# Patient Record
Sex: Female | Born: 1995 | Race: White | Hispanic: No | Marital: Married | Smoking: Never smoker
Health system: Southern US, Community
[De-identification: ages and names within clinical notes are randomized; demographics above are authoritative.]

## PROBLEM LIST (undated history)

## (undated) DIAGNOSIS — R1013 Epigastric pain: Secondary | ICD-10-CM

## (undated) DIAGNOSIS — R112 Nausea with vomiting, unspecified: Secondary | ICD-10-CM

## (undated) DIAGNOSIS — E063 Autoimmune thyroiditis: Secondary | ICD-10-CM

## (undated) DIAGNOSIS — F329 Major depressive disorder, single episode, unspecified: Secondary | ICD-10-CM

## (undated) DIAGNOSIS — M25559 Pain in unspecified hip: Secondary | ICD-10-CM

## (undated) DIAGNOSIS — R55 Syncope and collapse: Secondary | ICD-10-CM

## (undated) DIAGNOSIS — R519 Headache, unspecified: Secondary | ICD-10-CM

## (undated) DIAGNOSIS — R51 Headache: Secondary | ICD-10-CM

## (undated) DIAGNOSIS — Z22338 Carrier of other streptococcus: Secondary | ICD-10-CM

## (undated) DIAGNOSIS — I951 Orthostatic hypotension: Secondary | ICD-10-CM

## (undated) DIAGNOSIS — E0580 Other thyrotoxicosis without thyrotoxic crisis or storm: Secondary | ICD-10-CM

## (undated) DIAGNOSIS — F32A Depression, unspecified: Secondary | ICD-10-CM

## (undated) DIAGNOSIS — E049 Nontoxic goiter, unspecified: Secondary | ICD-10-CM

## (undated) DIAGNOSIS — Z7722 Contact with and (suspected) exposure to environmental tobacco smoke (acute) (chronic): Secondary | ICD-10-CM

## (undated) DIAGNOSIS — R5383 Other fatigue: Secondary | ICD-10-CM

## (undated) DIAGNOSIS — R109 Unspecified abdominal pain: Secondary | ICD-10-CM

## (undated) HISTORY — DX: Autoimmune thyroiditis: E06.3

## (undated) HISTORY — DX: Other fatigue: R53.83

## (undated) HISTORY — DX: Nausea with vomiting, unspecified: R11.2

## (undated) HISTORY — DX: Carrier of other streptococcus: Z22.338

## (undated) HISTORY — PX: TONSILLECTOMY AND ADENOIDECTOMY: SHX28

## (undated) HISTORY — DX: Pain in unspecified hip: M25.559

## (undated) HISTORY — DX: Nontoxic goiter, unspecified: E04.9

## (undated) HISTORY — DX: Other thyrotoxicosis without thyrotoxic crisis or storm: E05.80

## (undated) HISTORY — DX: Epigastric pain: R10.13

## (undated) HISTORY — DX: Syncope and collapse: R55

## (undated) HISTORY — DX: Orthostatic hypotension: I95.1

## (undated) HISTORY — DX: Contact with and (suspected) exposure to environmental tobacco smoke (acute) (chronic): Z77.22

## (undated) HISTORY — DX: Unspecified abdominal pain: R10.9

---
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
## 2013-01-19 ENCOUNTER — Encounter (HOSPITAL_COMMUNITY): Payer: Self-pay | Admitting: Emergency Medicine

## 2013-01-19 ENCOUNTER — Emergency Department (HOSPITAL_COMMUNITY)
Admission: EM | Admit: 2013-01-19 | Discharge: 2013-01-19 | Disposition: A | Discharge status: Home / Self Care | Payer: Managed Care, Other (non HMO) | Attending: Emergency Medicine | Admitting: Emergency Medicine

## 2013-01-19 DIAGNOSIS — Z8679 Personal history of other diseases of the circulatory system: Secondary | ICD-10-CM | POA: Insufficient documentation

## 2013-01-19 DIAGNOSIS — N12 Tubulo-interstitial nephritis, not specified as acute or chronic: Secondary | ICD-10-CM | POA: Insufficient documentation

## 2013-01-19 DIAGNOSIS — E063 Autoimmune thyroiditis: Secondary | ICD-10-CM | POA: Insufficient documentation

## 2013-01-19 DIAGNOSIS — Z79899 Other long term (current) drug therapy: Secondary | ICD-10-CM | POA: Insufficient documentation

## 2013-01-19 DIAGNOSIS — Z792 Long term (current) use of antibiotics: Secondary | ICD-10-CM | POA: Insufficient documentation

## 2013-01-19 DIAGNOSIS — Z3202 Encounter for pregnancy test, result negative: Secondary | ICD-10-CM | POA: Insufficient documentation

## 2013-01-19 DIAGNOSIS — E038 Other specified hypothyroidism: Secondary | ICD-10-CM | POA: Insufficient documentation

## 2013-01-19 DIAGNOSIS — Z8719 Personal history of other diseases of the digestive system: Secondary | ICD-10-CM | POA: Insufficient documentation

## 2013-01-19 LAB — CBC WITH DIFFERENTIAL/PLATELET
Basophils Absolute: 0 10*3/uL (ref 0.0–0.1)
Basophils Relative: 1 % (ref 0–1)
Eosinophils Absolute: 0 10*3/uL (ref 0.0–0.7)
Eosinophils Relative: 0 % (ref 0–5)
HCT: 38.9 % (ref 36.0–46.0)
HEMOGLOBIN: 14.3 g/dL (ref 12.0–15.0)
LYMPHS ABS: 1.5 10*3/uL (ref 0.7–4.0)
Lymphocytes Relative: 27 % (ref 12–46)
MCH: 31.6 pg (ref 26.0–34.0)
MCHC: 36.8 g/dL — ABNORMAL HIGH (ref 30.0–36.0)
MCV: 85.9 fL (ref 78.0–100.0)
Monocytes Absolute: 0.4 10*3/uL (ref 0.1–1.0)
Monocytes Relative: 7 % (ref 3–12)
NEUTROS ABS: 3.7 10*3/uL (ref 1.7–7.7)
NEUTROS PCT: 65 % (ref 43–77)
Platelets: 238 10*3/uL (ref 150–400)
RBC: 4.53 MIL/uL (ref 3.87–5.11)
RDW: 12 % (ref 11.5–15.5)
WBC: 5.6 10*3/uL (ref 4.0–10.5)

## 2013-01-19 LAB — COMPREHENSIVE METABOLIC PANEL
ALK PHOS: 42 U/L (ref 39–117)
ALT: 10 U/L (ref 0–35)
AST: 14 U/L (ref 0–37)
Albumin: 4.2 g/dL (ref 3.5–5.2)
BILIRUBIN TOTAL: 0.3 mg/dL (ref 0.3–1.2)
BUN: 5 mg/dL — ABNORMAL LOW (ref 6–23)
CHLORIDE: 102 meq/L (ref 96–112)
CO2: 23 mEq/L (ref 19–32)
Calcium: 9.7 mg/dL (ref 8.4–10.5)
Creatinine, Ser: 0.53 mg/dL (ref 0.50–1.10)
GFR calc non Af Amer: >90 mL/min (ref >90)
GLUCOSE: 99 mg/dL (ref 70–99)
POTASSIUM: 4 meq/L (ref 3.7–5.3)
Sodium: 140 mEq/L (ref 137–147)
Total Protein: 7.3 g/dL (ref 6.0–8.3)

## 2013-01-19 LAB — URINALYSIS, ROUTINE W REFLEX MICROSCOPIC
BILIRUBIN URINE: NEGATIVE
Glucose, UA: NEGATIVE mg/dL
KETONES UR: NEGATIVE mg/dL
NITRITE: NEGATIVE
Protein, ur: 30 mg/dL — AB
Specific Gravity, Urine: 1.014 (ref 1.005–1.030)
Urobilinogen, UA: 1 mg/dL (ref 0.0–1.0)
pH: 6 (ref 5.0–8.0)

## 2013-01-19 LAB — URINE MICROSCOPIC-ADD ON

## 2013-01-19 LAB — POCT PREGNANCY, URINE: Preg Test, Ur: NEGATIVE

## 2013-01-19 MED ORDER — ONDANSETRON 4 MG PO TBDP: 4.0000 mg | ORAL_TABLET | Freq: Three times a day (TID) | ORAL | Status: DC | PRN

## 2013-01-19 MED ORDER — ONDANSETRON HCL 4 MG/2ML IJ SOLN
4.0000 mg | Freq: Once | INTRAMUSCULAR | Status: AC
  Administered 2013-01-19: 4 mg via INTRAVENOUS
  Filled 2013-01-19: qty 2

## 2013-01-19 MED ORDER — SODIUM CHLORIDE 0.9 % IV BOLUS (SEPSIS)
1000.0000 mL | Freq: Once | INTRAVENOUS | Status: AC
  Administered 2013-01-19: 1000 mL via INTRAVENOUS

## 2013-01-19 MED ORDER — DEXTROSE 5 % IV SOLN
1.0000 g | Freq: Once | INTRAVENOUS | Status: AC
  Administered 2013-01-19: 1 g via INTRAVENOUS
  Filled 2013-01-19: qty 10

## 2013-01-19 MED ORDER — CEPHALEXIN 500 MG PO CAPS: 500.0000 mg | ORAL_CAPSULE | Freq: Four times a day (QID) | ORAL | Status: DC

## 2013-01-19 MED ORDER — HYDROCODONE-ACETAMINOPHEN 5-325 MG PO TABS: 2.0000 | ORAL_TABLET | Freq: Four times a day (QID) | ORAL | Status: DC | PRN

## 2013-01-19 MED ORDER — KETOROLAC TROMETHAMINE 30 MG/ML IJ SOLN
30.0000 mg | Freq: Once | INTRAMUSCULAR | Status: AC
  Administered 2013-01-19: 30 mg via INTRAVENOUS
  Filled 2013-01-19: qty 1

## 2013-01-19 NOTE — ED Provider Notes (Signed)
Medical screening examination/treatment/procedure(s) were performed by non-physician practitioner and as supervising physician I was immediately available for consultation/collaboration.  EKG Interpretation   None         Andric Kerce, MD 01/19/13 1950 

## 2013-01-19 NOTE — ED Provider Notes (Signed)
CSN: [id number]     Arrival date & time 01/19/13  1302 History   First MD Initiated Contact with Patient 01/19/13 1536     Chief Complaint  Patient presents with  . Abdominal Pain   (Consider location/radiation/quality/duration/timing/severity/associated sxs/prior Treatment) HPI Comments: Patient presenting with a chief complaint of dysuria, increased urinary frequency, urgency, abdominal pain, right flank pain, nausea, and vomiting.  She reports that the symptoms started three days ago and have been worsening since that time.  She states that she was seen in [facility] ED [facility] two days ago and was diagnosed with a UTI at that time and started on Cipro.  She has taken four doses of Cipro, but does not feel that it is helping.  She reports that a CT scan was done at that time, which was negative for kidney stones.  She denies fever, chills, vaginal discharge, or hematuria.  She has not taken anything for pain or nausea prior to arrival.    Patient is a 18 y.o. female presenting with abdominal pain. The history is provided by the patient.  Abdominal Pain Associated symptoms: dysuria, nausea and vomiting   Associated symptoms: no chills, no fever, no hematuria, no vaginal bleeding and no vaginal discharge     Past Medical History  Diagnosis Date  . Nausea with vomiting   . Pain in joint, pelvic region and thigh     right hip pain  . Tobacco smoke exposure     positive hx of passive tobacco smoke exposure  . 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 Abdominal pain, unspecified site   . Pre-syncope 03/2008    cardiac referral for pre syncope 03/10  . Thyroiditis, autoimmune   . Goiter   . Thyrotoxicosis with Hashimoto thyroiditis   . Hypothyroidism, acquired, autoimmune   . Fatigue   . Dyspepsia   . Orthostatic hypotension    Past Surgical History  Procedure Laterality Date  . Tonsillectomy and adenoidectomy  11/2001  . Tonsillectomy and adenoidectomy      Family History  Problem Relation Age of Onset  . Migraines Mother   . Thyroid disease Mother   . Heart disease Maternal Grandmother   . Diabetes Maternal Grandfather   . Diabetes Paternal Grandfather   . Thyroid disease Paternal Grandfather    History  Substance Use Topics  . Smoking status: Never Smoker   . Smokeless tobacco: Never Used  . Alcohol Use: No   OB History   Grav Para Term Preterm Abortions TAB SAB Ect Mult Living                 Review of Systems  Constitutional: Negative for fever and chills.  Gastrointestinal: Positive for nausea, vomiting and abdominal pain.  Genitourinary: Positive for dysuria, urgency and frequency. Negative for hematuria, vaginal bleeding and vaginal discharge.  All other systems reviewed and are negative.    Allergies  Prochlorperazine edisylate  Home Medications   Current Outpatient Rx  Name  Route  Sig  Dispense  Refill  . acetaminophen (TYLENOL) 500 MG tablet   Oral   Take 1,000 mg by mouth every 6 (six) hours as needed.         . ciprofloxacin (CIPRO) 500 MG tablet   Oral   Take 500 mg by mouth 2 (two) times daily.         . Lactobacillus (ACIDOPHILUS PO)   Oral   Take 1 capsule by mouth daily.         .Marland Kitchen  levothyroxine (SYNTHROID, LEVOTHROID) 25 MCG tablet   Oral   Take 1 tablet (25 mcg total) by mouth daily.   30 tablet   11   . Norethin Ace-Eth Estrad-FE (MINASTRIN 24 FE) 1-20 MG-MCG(24) CHEW   Oral   Chew 1 tablet by mouth daily.   84 tablet   3    BP 109/68  Pulse 72  Temp(Src) 97.5 F (36.4 C) (Oral)  Resp 18  SpO2 100% Physical Exam  Nursing note and vitals reviewed. Constitutional: She appears well-developed and well-nourished.  HENT:  Head: Normocephalic and atraumatic.  Mouth/Throat: Oropharynx is clear and moist.  Neck: Normal range of motion. Neck supple.  Cardiovascular: Normal rate, regular rhythm and normal heart sounds.   Pulmonary/Chest: Effort normal and breath sounds normal.   Abdominal: Soft. Bowel sounds are normal. She exhibits no distension and no mass. There is tenderness in the suprapubic area. There is no rebound and no guarding.  Right CVA tenderness  Musculoskeletal: Normal range of motion.  Neurological: She is alert.  Skin: Skin is warm and dry.  Psychiatric: She has a normal mood and affect.    ED Course  Procedures (including critical care time) Labs Review Labs Reviewed  CBC WITH DIFFERENTIAL - Abnormal; Notable for the following:    MCHC 36.8 (*)    All other components within normal limits  COMPREHENSIVE METABOLIC PANEL - Abnormal; Notable for the following:    BUN 5 (*)    All other components within normal limits  URINALYSIS, ROUTINE W REFLEX MICROSCOPIC - Abnormal; Notable for the following:    APPearance TURBID (*)    Hgb urine dipstick MODERATE (*)    Protein, ur 30 (*)    Leukocytes, UA LARGE (*)    All other components within normal limits  URINE MICROSCOPIC-ADD ON - Abnormal; Notable for the following:    Squamous Epithelial / LPF MANY (*)    Bacteria, UA MANY (*)    All other components within normal limits  URINE CULTURE  POCT PREGNANCY, URINE   Imaging Review No results found.  EKG Interpretation   None     7:00 PM Reassessed patient.  She reports that her nausea has improved.  Will fluid challenge and reassess.  7:30 PM Reassessed patient.  She reports that her pain and nausea have improved.  Repeat abdominal exam.  Mild diffuse abdominal tenderness.  No rebound or guarding.  MDM  No diagnosis found. Patient presenting with urinary symptoms, flank pain, nausea, and vomiting.  UA showing UTI.  Patient is currently afebrile.  Patient given IV Rocephin while in the ED.  Patient also given Zofran IV, which improved nausea.  Patient able to tolerate PO liquids prior to discharge.  Labs unremarkable.  Urine pregnancy negative. Urine cultured.  Symptoms most consistent with Pyelonephritis.  Patient discharged home with  pain medication, Zofran ODT, and Keflex.  Return precautions given.   Santiago Glad, PA-C 01/19/13 1942  Santiago Glad, PA-C 01/19/13 1942

## 2013-01-19 NOTE — ED Notes (Signed)
Pt presents to department for evaluation of diffuse abdominal pain. Was seen at [facility] on Friday, diagnosed with UTI and started on Cipro. Now states no relief from symptoms, continued abdominal pain and frequent urination. 8/10 pain upon arrival. Pt is alert and oriented x4.

## 2013-01-19 NOTE — Discharge Instructions (Signed)
Take pain medication as needed for severe pain.  Do not drive or operate heavy machinery for four hours after taking pain medication.

## 2013-01-20 LAB — URINE CULTURE
CULTURE: NO GROWTH
Colony Count: NO GROWTH

## 2013-01-29 ENCOUNTER — Other Ambulatory Visit: Payer: Self-pay

## 2013-01-29 DIAGNOSIS — E039 Hypothyroidism, unspecified: Secondary | ICD-10-CM

## 2013-01-29 DIAGNOSIS — E038 Other specified hypothyroidism: Secondary | ICD-10-CM

## 2013-01-29 MED ORDER — LEVOTHYROXINE SODIUM 25 MCG PO TABS: 25.0000 ug | ORAL_TABLET | Freq: Every day | ORAL | Status: DC

## 2013-01-29 MED ORDER — NORETHIN ACE-ETH ESTRAD-FE 1-20 MG-MCG(24) PO CHEW: 1.0000 | CHEWABLE_TABLET | Freq: Every day | ORAL | Status: DC

## 2013-01-29 NOTE — Telephone Encounter (Signed)
Patient's insurance is requiring mail order, so i forwarded the requested Rx with remaining refills  

## 2013-01-29 NOTE — Addendum Note (Signed)
Addended by: Roena MaladyEVONTENNO, Moesha Sarchet Y on: 01/29/2013 09:17 AM   Modules accepted: Orders

## 2013-02-10 ENCOUNTER — Encounter: Payer: Self-pay | Admitting: Family Medicine

## 2013-02-10 ENCOUNTER — Ambulatory Visit (INDEPENDENT_AMBULATORY_CARE_PROVIDER_SITE_OTHER): Payer: Managed Care, Other (non HMO) | Admitting: Family Medicine

## 2013-02-10 VITALS — BP 98/62 | HR 119 | Temp 97.8°F | Ht 63.5 in | Wt 122.5 lb

## 2013-02-10 DIAGNOSIS — N12 Tubulo-interstitial nephritis, not specified as acute or chronic: Secondary | ICD-10-CM

## 2013-02-10 DIAGNOSIS — F329 Major depressive disorder, single episode, unspecified: Secondary | ICD-10-CM

## 2013-02-10 DIAGNOSIS — Z8659 Personal history of other mental and behavioral disorders: Secondary | ICD-10-CM | POA: Insufficient documentation

## 2013-02-10 DIAGNOSIS — Z87440 Personal history of urinary (tract) infections: Secondary | ICD-10-CM

## 2013-02-10 DIAGNOSIS — F32A Depression, unspecified: Secondary | ICD-10-CM

## 2013-02-10 DIAGNOSIS — F3289 Other specified depressive episodes: Secondary | ICD-10-CM

## 2013-02-10 LAB — POCT URINALYSIS DIPSTICK
Glucose, UA: NEGATIVE
NITRITE UA: NEGATIVE
PH UA: 6
SPEC GRAV UA: 1.025
Urobilinogen, UA: 0.2

## 2013-02-10 MED ORDER — SERTRALINE HCL 50 MG PO TABS: 50.0000 mg | ORAL_TABLET | Freq: Every day | ORAL | Status: DC

## 2013-02-10 NOTE — Patient Instructions (Signed)
Start sertaline (zoloft) 1/2 pill daily in the evening with food for 2 weeks and then increase to a whole pill daily Stop up front for a referral to counseling I will culture your urine and update you with a result Go for some walks  Follow up with me in about a month   If you get worse / suicidal thoughts - stop medicine/ alert me / get help

## 2013-02-10 NOTE — Assessment & Plan Note (Addendum)
Improved symptoms  ua - blood (pt has menses)- few leukocytes  Will send for culture Disc imp of good water intake  Rev ED notes and studies in detail

## 2013-02-10 NOTE — Assessment & Plan Note (Signed)
Reviewed stressors/ coping techniques/symptoms/ support sources/ tx options and side effects in detail today Will begin zoloft 25 mg -titrate to 50 mg as tolerated Disc poss side eff in detail - incl worse dep or SI  Family will watch her closely  Ref to counseling F/u planned  >25 minutes spent in face to face time with patient, >50% spent in counselling or coordination of care

## 2013-02-10 NOTE — Progress Notes (Signed)
Subjective:    Patient ID: Mary Crawford, female    DOB: [birth year], 18 y.o.   MRN: [id number]  HPI Here for f/u of ER visit for pyelonephritis on 1/18  Was first seen at [facility] ctr per pt -given cipro for uti and CT neg F/u with cone- no symptom imp on 1/18 She was given rocephin and zofran - then d/c on keflex ucx was neg but pos uti   Wt is down 15lb  Results for orders placed in visit on 02/10/13  POCT URINALYSIS DIPSTICK      Result Value Range   Color, UA yellow     Clarity, UA hazy     Glucose, UA neg.     Bilirubin, UA small     Ketones, UA trace     Spec Grav, UA 1.025     Blood, UA large     pH, UA 6.0     Protein, UA 30+     Urobilinogen, UA 0.2     Nitrite, UA neg.     Leukocytes, UA Trace     she is having period   Lost 15 lb  She is having a lot of mood issues  "can't eat" and gets nausea -- thinks that is from the mood  Had to drop out of school  Had bad experience - boyfriend was cheating on her with a friend , and also somewhat overwhelmed at college - followed by a kidney infection  Also that college was not a good fit for her - she is looking at some other choices  Is "mopey"  She feels vegetative at times and anxious at times Thinks this is the first times she has had depression Thinks her mother has depression-and takes medicine  Pt has never seen a counselor and never been on medicine   Coping mech- watches TV and also gets on the internet Does not feel like going out  Does not feel like exercise - but likes to walk  No suicidal intention - ? If she has had thoughts in the past  Living with her parents right now   appetitie is lousy Wakes up a lot at night   Lab Results  Component Value Date   TSH 1.81 10/14/2012     Patient Active Problem List   Diagnosis Date Noted  . Pyelonephritis 02/10/2013  . Uses oral contraception 10/14/2012  . Frequent urination 10/14/2012  . Well adolescent visit 06/19/2011  . Goiter   . History  of Hashimoto thyroiditis   . Hypothyroidism, acquired, autoimmune   . Orthostatic hypotension   . HYPERTHYROIDISM 12/18/2007  . CARRIER OR SUSPECTED CARRIER OTHER STREPTOCOCCUS 02/04/2007   Past Medical History  Diagnosis Date  . Nausea with vomiting   . Pain in joint, pelvic region and thigh     right hip pain  . Tobacco smoke exposure     positive hx of passive tobacco smoke exposure  . 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 Abdominal pain, unspecified site   . Pre-syncope 03/2008    cardiac referral for pre syncope 03/10  . Thyroiditis, autoimmune   . Goiter   . Thyrotoxicosis with Hashimoto thyroiditis   . Hypothyroidism, acquired, autoimmune   . Fatigue   . Dyspepsia   . Orthostatic hypotension    Past Surgical History  Procedure Laterality Date  . Tonsillectomy and adenoidectomy  11/2001  . Tonsillectomy and adenoidectomy     History  Substance Use Topics  .  Smoking status: Never Smoker   . Smokeless tobacco: Never Used  . Alcohol Use: No   Family History  Problem Relation Age of Onset  . Migraines Mother   . Thyroid disease Mother   . Heart disease Maternal Grandmother   . Diabetes Maternal Grandfather   . Diabetes Paternal Grandfather   . Thyroid disease Paternal Grandfather    Allergies  Allergen Reactions  . Prochlorperazine Edisylate     REACTION: anxiety   Current Outpatient Prescriptions on File Prior to Visit  Medication Sig Dispense Refill  . levothyroxine (SYNTHROID, LEVOTHROID) 25 MCG tablet Take 1 tablet (25 mcg total) by mouth daily.  90 tablet  3  . Norethin Ace-Eth Estrad-FE (MINASTRIN 24 FE) 1-20 MG-MCG(24) CHEW Chew 1 tablet by mouth daily.  84 tablet  2  . ondansetron (ZOFRAN ODT) 4 MG disintegrating tablet Take 1 tablet (4 mg total) by mouth every 8 (eight) hours as needed for nausea or vomiting.  20 tablet  0  . acetaminophen (TYLENOL) 500 MG tablet Take 1,000 mg by mouth every 6 (six) hours as needed.      Marland Kitchen.  HYDROcodone-acetaminophen (NORCO/VICODIN) 5-325 MG per tablet Take 2 tablets by mouth every 6 (six) hours as needed.  15 tablet  0   No current facility-administered medications on file prior to visit.    Review of Systems Review of Systems  Constitutional: Negative for fever, appetite change, fatigue and unexpected weight change.  Eyes: Negative for pain and visual disturbance.  Respiratory: Negative for cough and shortness of breath.   Cardiovascular: Negative for cp or palpitations    Gastrointestinal: Negative for nausea, diarrhea and constipation.  Genitourinary: Negative for urgency and frequency. pos for occ bladder discomfort/neg for dysuria  Skin: Negative for pallor or rash   Neurological: Negative for weakness, light-headedness, numbness and headaches.  Hematological: Negative for adenopathy. Does not bruise/bleed easily.  Psychiatric/Behavioral: pos for symptoms of depression and anxiety , neg for suicidal intent.         Objective:   Physical Exam  Constitutional: She appears well-developed and well-nourished. No distress.  HENT:  Head: Normocephalic and atraumatic.  Mouth/Throat: Oropharynx is clear and moist.  Eyes: Conjunctivae and EOM are normal. Pupils are equal, round, and reactive to light. Right eye exhibits no discharge. Left eye exhibits no discharge. No scleral icterus.  Neck: Normal range of motion. Neck supple. No thyromegaly present.  Cardiovascular: Normal rate, regular rhythm, normal heart sounds and intact distal pulses.  Exam reveals no gallop.   Pulmonary/Chest: Effort normal and breath sounds normal. No respiratory distress. She has no wheezes. She has no rales.  Abdominal: Soft. Bowel sounds are normal. She exhibits no distension and no mass. There is no tenderness.  No suprapubic tenderness or fullness   No cva tenderness   Musculoskeletal: She exhibits no edema and no tenderness.  Lymphadenopathy:    She has no cervical adenopathy.  Neurological:  She is alert. She has normal reflexes. No cranial nerve deficit. She exhibits normal muscle tone. Coordination normal.  Skin: Skin is warm and dry. No rash noted. No erythema. No pallor.  Psychiatric: Her behavior is normal. Thought content normal. Her mood appears anxious. Her affect is not blunt and not labile. Thought content is not paranoid. Cognition and memory are normal. She exhibits a depressed mood. She expresses no homicidal and no suicidal ideation.  Pt speaks candidly about stressors and mood  Good attentiveness occ tearful  Assessment & Plan:

## 2013-02-10 NOTE — Progress Notes (Signed)
Pre-visit discussion using our clinic review tool. No additional management support is needed unless otherwise documented below in the visit note.  

## 2013-02-11 LAB — URINE CULTURE
Colony Count: NO GROWTH
Organism ID, Bacteria: NO GROWTH

## 2013-02-27 ENCOUNTER — Ambulatory Visit: Payer: Managed Care, Other (non HMO) | Admitting: Psychology

## 2013-03-10 ENCOUNTER — Ambulatory Visit: Payer: Managed Care, Other (non HMO) | Admitting: Family Medicine

## 2013-03-11 ENCOUNTER — Encounter: Payer: Self-pay | Admitting: Family Medicine

## 2013-03-11 ENCOUNTER — Ambulatory Visit (INDEPENDENT_AMBULATORY_CARE_PROVIDER_SITE_OTHER): Payer: Managed Care, Other (non HMO) | Admitting: Family Medicine

## 2013-03-11 VITALS — BP 94/62 | HR 91 | Temp 98.3°F | Ht 63.5 in | Wt 122.5 lb

## 2013-03-11 DIAGNOSIS — F32A Depression, unspecified: Secondary | ICD-10-CM

## 2013-03-11 DIAGNOSIS — F329 Major depressive disorder, single episode, unspecified: Secondary | ICD-10-CM

## 2013-03-11 DIAGNOSIS — F3289 Other specified depressive episodes: Secondary | ICD-10-CM

## 2013-03-11 MED ORDER — SERTRALINE HCL 50 MG PO TABS: 50.0000 mg | ORAL_TABLET | Freq: Every day | ORAL | Status: DC

## 2013-03-11 NOTE — Progress Notes (Signed)
Pre visit review using our clinic review tool, if applicable. No additional management support is needed unless otherwise documented below in the visit note. 

## 2013-03-11 NOTE — Patient Instructions (Signed)
I'm glad you are feeling better  Stay on current dose of zoloft  For sleep- try benadryl 25-50 mg in late evening or bedtime  Get exercise Avoid caffeine  Let me know if no improvement  Follow up in about 6 months

## 2013-03-11 NOTE — Progress Notes (Signed)
Subjective:    Patient ID: Mary Crawford, female    DOB: [birth year], 18 y.o.   MRN: [id number]  HPI Here for f/u of symptoms of depression   Last visit started zoloft to titrate up to 50 mg  Thinks the medicine helped a lot  Was nauseated at first-that is better now   Some trouble falling/ staying asleep at night ? Side eff   Wt is stable  Appetite is better -eating a lot more   More relaxed  Not tearful or down in the dumps  Stress level is "not too bad"  Patient Active Problem List   Diagnosis Date Noted  . Pyelonephritis 02/10/2013  . Depression 02/10/2013  . Uses oral contraception 10/14/2012  . Frequent urination 10/14/2012  . Well adolescent visit 06/19/2011  . Goiter   . History of Hashimoto thyroiditis   . Hypothyroidism, acquired, autoimmune   . Orthostatic hypotension   . HYPERTHYROIDISM 12/18/2007  . CARRIER OR SUSPECTED CARRIER OTHER STREPTOCOCCUS 02/04/2007   Past Medical History  Diagnosis Date  . Nausea with vomiting   . Pain in joint, pelvic region and thigh     right hip pain  . Tobacco smoke exposure     positive hx of passive tobacco smoke exposure  . 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 Abdominal pain, unspecified site   . Pre-syncope 03/2008    cardiac referral for pre syncope 03/10  . Thyroiditis, autoimmune   . Goiter   . Thyrotoxicosis with Hashimoto thyroiditis   . Hypothyroidism, acquired, autoimmune   . Fatigue   . Dyspepsia   . Orthostatic hypotension    Past Surgical History  Procedure Laterality Date  . Tonsillectomy and adenoidectomy  11/2001  . Tonsillectomy and adenoidectomy     History  Substance Use Topics  . Smoking status: Never Smoker   . Smokeless tobacco: Never Used  . Alcohol Use: No   Family History  Problem Relation Age of Onset  . Migraines Mother   . Thyroid disease Mother   . Heart disease Maternal Grandmother   . Diabetes Maternal Grandfather   . Diabetes Paternal Grandfather    . Thyroid disease Paternal Grandfather    Allergies  Allergen Reactions  . Prochlorperazine Edisylate     REACTION: anxiety   Current Outpatient Prescriptions on File Prior to Visit  Medication Sig Dispense Refill  . levothyroxine (SYNTHROID, LEVOTHROID) 25 MCG tablet Take 1 tablet (25 mcg total) by mouth daily.  90 tablet  3  . Norethin Ace-Eth Estrad-FE (MINASTRIN 24 FE) 1-20 MG-MCG(24) CHEW Chew 1 tablet by mouth daily.  84 tablet  2  . sertraline (ZOLOFT) 50 MG tablet Take 1 tablet (50 mg total) by mouth daily.  30 tablet  3   No current facility-administered medications on file prior to visit.      Review of Systems Review of Systems  Constitutional: Negative for fever, appetite change, fatigue and unexpected weight change.  Eyes: Negative for pain and visual disturbance.  Respiratory: Negative for cough and shortness of breath.   Cardiovascular: Negative for cp or palpitations    Gastrointestinal: Negative for nausea, diarrhea and constipation.  Genitourinary: Negative for urgency and frequency.  Skin: Negative for pallor or rash   Neurological: Negative for weakness, light-headedness, numbness and headaches.  Hematological: Negative for adenopathy. Does not bruise/bleed easily.  Psychiatric/Behavioral: Negative for dysphoric mood. The patient is not nervous/anxious.  pos for some difficulty sleeping  Objective:   Physical Exam  Constitutional: She appears well-developed and well-nourished. No distress.  HENT:  Head: Normocephalic and atraumatic.  Eyes: Conjunctivae and EOM are normal. Pupils are equal, round, and reactive to light.  Neck: Normal range of motion. Neck supple. No thyromegaly present.  Cardiovascular: Normal rate and regular rhythm.   Pulmonary/Chest: Effort normal and breath sounds normal.  Lymphadenopathy:    She has no cervical adenopathy.  Neurological: She is alert. She has normal reflexes. She displays no tremor. No cranial nerve deficit.  She exhibits normal muscle tone. Coordination normal.  Skin: Skin is warm and dry. No rash noted. No pallor.  Psychiatric: She has a normal mood and affect.  Much improved           Assessment & Plan:

## 2013-03-12 NOTE — Assessment & Plan Note (Signed)
Much imp with zoloft  Some trouble sleeping-disc sleep hygeine / need for exercise and also suggested benadryl at bedtime Will update

## 2013-05-28 ENCOUNTER — Ambulatory Visit (INDEPENDENT_AMBULATORY_CARE_PROVIDER_SITE_OTHER): Payer: Managed Care, Other (non HMO) | Admitting: Family Medicine

## 2013-05-28 ENCOUNTER — Encounter: Payer: Self-pay | Admitting: Family Medicine

## 2013-05-28 VITALS — BP 104/62 | HR 114 | Temp 98.6°F | Ht 64.0 in | Wt 128.5 lb

## 2013-05-28 DIAGNOSIS — Z0289 Encounter for other administrative examinations: Secondary | ICD-10-CM

## 2013-05-28 DIAGNOSIS — Z020 Encounter for examination for admission to educational institution: Secondary | ICD-10-CM | POA: Insufficient documentation

## 2013-05-28 DIAGNOSIS — G43009 Migraine without aura, not intractable, without status migrainosus: Secondary | ICD-10-CM | POA: Insufficient documentation

## 2013-05-28 NOTE — Patient Instructions (Signed)
For headache prevention avoid caffeine and drink more water, try to go to bed and get up at the same time every day  Avoid otc medicines if possible -- to avoid analgesic rebound headaches  Have the staff up front copy your immunization form  No restrictions for college

## 2013-05-28 NOTE — Progress Notes (Signed)
Pre visit review using our clinic review tool, if applicable. No additional management support is needed unless otherwise documented below in the visit note. 

## 2013-05-28 NOTE — Progress Notes (Signed)
Subjective:    Patient ID: Mary Crawford, female    DOB: [birth year], 18 y.o.   MRN: [id number]  HPI Here for pre college visit - [employer] - wants to get degree in education Is excited about that  Has a relaxing summer planned   Mood has been good Medicine is still working   More migraines lately (they run in her family)  Gets a headache every day-thinks it is stress related  Has been to a headache specialist in the past  No aura  Throbbing pain - both sides of head  Takes a tylenol and lies down in dark room (photophobia) - usually better in 1-2 hours Usually gets them in the afternoon   Sleep - she thinks she gets too much 9-12  Different bed and wake times No caffeine  Not enough water   No relation to med change   She does not want to go on proph med-would like to try lifestyle   Patient Active Problem List   Diagnosis Date Noted  . Pyelonephritis 02/10/2013  . Depression 02/10/2013  . Uses oral contraception 10/14/2012  . Well adolescent visit 06/19/2011  . Goiter   . History of Hashimoto thyroiditis   . Hypothyroidism, acquired, autoimmune   . Orthostatic hypotension   . HYPERTHYROIDISM 12/18/2007  . CARRIER OR SUSPECTED CARRIER OTHER STREPTOCOCCUS 02/04/2007   Past Medical History  Diagnosis Date  . Nausea with vomiting   . Pain in joint, pelvic region and thigh     right hip pain  . Tobacco smoke exposure     positive hx of passive tobacco smoke exposure  . 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 Abdominal pain, unspecified site   . Pre-syncope 03/2008    cardiac referral for pre syncope 03/10  . Thyroiditis, autoimmune   . Goiter   . Thyrotoxicosis with Hashimoto thyroiditis   . Hypothyroidism, acquired, autoimmune   . Fatigue   . Dyspepsia   . Orthostatic hypotension    Past Surgical History  Procedure Laterality Date  . Tonsillectomy and adenoidectomy  11/2001  . Tonsillectomy and adenoidectomy     History    Substance Use Topics  . Smoking status: Never Smoker   . Smokeless tobacco: Never Used  . Alcohol Use: No   Family History  Problem Relation Age of Onset  . Migraines Mother   . Thyroid disease Mother   . Heart disease Maternal Grandmother   . Diabetes Maternal Grandfather   . Diabetes Paternal Grandfather   . Thyroid disease Paternal Grandfather    Allergies  Allergen Reactions  . Prochlorperazine Edisylate     REACTION: anxiety   Current Outpatient Prescriptions on File Prior to Visit  Medication Sig Dispense Refill  . levothyroxine (SYNTHROID, LEVOTHROID) 25 MCG tablet Take 1 tablet (25 mcg total) by mouth daily.  90 tablet  3  . Norethin Ace-Eth Estrad-FE (MINASTRIN 24 FE) 1-20 MG-MCG(24) CHEW Chew 1 tablet by mouth daily.  84 tablet  2  . sertraline (ZOLOFT) 50 MG tablet Take 1 tablet (50 mg total) by mouth daily.  90 tablet  3   No current facility-administered medications on file prior to visit.     Review of Systems Review of Systems  Constitutional: Negative for fever, appetite change, fatigue and unexpected weight change.  Eyes: Negative for pain and visual disturbance.  Respiratory: Negative for cough and shortness of breath.   Cardiovascular: Negative for cp or palpitations    Gastrointestinal:  Negative for nausea, diarrhea and constipation.  Genitourinary: Negative for urgency and frequency.  Skin: Negative for pallor or rash   Neurological: Negative for weakness, light-headedness, numbness and pos for headaches   Hematological: Negative for adenopathy. Does not bruise/bleed easily.  Psychiatric/Behavioral: Negative for dysphoric mood. The patient is not nervous/anxious.         Objective:   Physical Exam  Constitutional: She appears well-developed and well-nourished. No distress.  HENT:  Head: Normocephalic and atraumatic.  Right Ear: External ear normal.  Left Ear: External ear normal.  Nose: Nose normal.  Mouth/Throat: Oropharynx is clear and  moist.  Eyes: Conjunctivae and EOM are normal. Pupils are equal, round, and reactive to light. Right eye exhibits no discharge. Left eye exhibits no discharge. No scleral icterus.  Neck: Normal range of motion. Neck supple. No JVD present. No thyromegaly present.  Cardiovascular: Normal rate, regular rhythm, normal heart sounds and intact distal pulses.  Exam reveals no gallop.   Pulmonary/Chest: Effort normal and breath sounds normal. No respiratory distress. She has no wheezes. She has no rales.  Abdominal: Soft. Bowel sounds are normal. She exhibits no distension and no mass. There is no tenderness.  Musculoskeletal: She exhibits no edema and no tenderness.  Lymphadenopathy:    She has no cervical adenopathy.  Neurological: She is alert. She has normal strength and normal reflexes. She displays no atrophy and no tremor. No cranial nerve deficit or sensory deficit. She exhibits normal muscle tone. Coordination and gait normal.  Nl gait  No cerebellar signs   Skin: Skin is warm and dry. No rash noted. No erythema. No pallor.  Psychiatric: She has a normal mood and affect.          Assessment & Plan:

## 2013-05-29 NOTE — Assessment & Plan Note (Signed)
No restrictions for college Doing well physically/developmentally Mary Crawford and academically  Antic guidance for college and gen health Stressed self care  Pt declines std screen

## 2013-05-29 NOTE — Assessment & Plan Note (Signed)
Reviewed lifestyle change in detail  See AVS  Pt does not want to take proph med at this time Disc analgesic rebound as well  Handouts given  Will update if no imp

## 2013-09-15 ENCOUNTER — Other Ambulatory Visit: Payer: Self-pay | Admitting: Family Medicine

## 2013-10-06 ENCOUNTER — Encounter: Payer: Self-pay | Admitting: Family Medicine

## 2013-10-06 ENCOUNTER — Ambulatory Visit (INDEPENDENT_AMBULATORY_CARE_PROVIDER_SITE_OTHER): Payer: Managed Care, Other (non HMO) | Admitting: Family Medicine

## 2013-10-06 VITALS — BP 104/64 | HR 94 | Temp 99.1°F | Ht 64.0 in | Wt 142.2 lb

## 2013-10-06 DIAGNOSIS — M546 Pain in thoracic spine: Secondary | ICD-10-CM

## 2013-10-06 LAB — POCT URINALYSIS DIPSTICK
BILIRUBIN UA: NEGATIVE
Blood, UA: NEGATIVE
GLUCOSE UA: NEGATIVE
KETONES UA: NEGATIVE
Leukocytes, UA: NEGATIVE
NITRITE UA: NEGATIVE
PH UA: 6
Protein, UA: NEGATIVE
Spec Grav, UA: 1.02
Urobilinogen, UA: 0.2

## 2013-10-06 MED ORDER — MELOXICAM 15 MG PO TABS: 15.0000 mg | ORAL_TABLET | Freq: Every day | ORAL | Status: DC | PRN

## 2013-10-06 MED ORDER — CYCLOBENZAPRINE HCL 10 MG PO TABS: 5.0000 mg | ORAL_TABLET | Freq: Three times a day (TID) | ORAL | Status: DC | PRN

## 2013-10-06 NOTE — Progress Notes (Signed)
Pre visit review using our clinic review tool, if applicable. No additional management support is needed unless otherwise documented below in the visit note. 

## 2013-10-06 NOTE — Progress Notes (Signed)
Subjective:    Patient ID: Mary Crawford, female    DOB: 1995-01-10, 18 y.o.   MRN: [id number]  HPI Here for back pain  It started on Thursday   Also pain over L shoulder blade area  Pain is all the way down - worse on the left  Neck is fine  She wears her father's lumbar support brace-it is helpful   She does carry heavy books to class   Best to walk and stand  Worse to sit  Lying down flat is better   No pain rad down legs  No numbness/ weakness  Took tylenol-no help  Has not taken an nsaid   Results for orders placed in visit on 02/10/13  URINE CULTURE      Result Value Ref Range   Colony Count NO GROWTH     Organism ID, Bacteria NO GROWTH    POCT URINALYSIS DIPSTICK      Result Value Ref Range   Color, UA yellow     Clarity, UA hazy     Glucose, UA neg.     Bilirubin, UA small     Ketones, UA trace     Spec Grav, UA 1.025     Blood, UA large     pH, UA 6.0     Protein, UA 30+     Urobilinogen, UA 0.2     Nitrite, UA neg.     Leukocytes, UA Trace        Patient Active Problem List   Diagnosis Date Noted  . Back pain, thoracic 10/06/2013  . School health examination 05/28/2013  . Migraine without aura 05/28/2013  . Pyelonephritis 02/10/2013  . Depression 02/10/2013  . Uses oral contraception 10/14/2012  . Well adolescent visit 06/19/2011  . Goiter   . History of Hashimoto thyroiditis   . Hypothyroidism, acquired, autoimmune   . Orthostatic hypotension   . HYPERTHYROIDISM 12/18/2007  . CARRIER OR SUSPECTED CARRIER OTHER STREPTOCOCCUS 02/04/2007   Past Medical History  Diagnosis Date  . Nausea with vomiting   . Pain in joint, pelvic region and thigh     right hip pain  . Tobacco smoke exposure     positive hx of passive tobacco smoke exposure  . 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 Abdominal pain, unspecified site   . Pre-syncope 03/2008    cardiac referral for pre syncope 03/10  . Thyroiditis, autoimmune   . Goiter     . Thyrotoxicosis with Hashimoto thyroiditis   . Hypothyroidism, acquired, autoimmune   . Fatigue   . Dyspepsia   . Orthostatic hypotension    Past Surgical History  Procedure Laterality Date  . Tonsillectomy and adenoidectomy  11/2001  . Tonsillectomy and adenoidectomy     History  Substance Use Topics  . Smoking status: Never Smoker   . Smokeless tobacco: Never Used  . Alcohol Use: No   Family History  Problem Relation Age of Onset  . Migraines Mother   . Thyroid disease Mother   . Heart disease Maternal Grandmother   . Diabetes Maternal Grandfather   . Diabetes Paternal Grandfather   . Thyroid disease Paternal Grandfather    Allergies  Allergen Reactions  . Prochlorperazine Edisylate     REACTION: anxiety   Current Outpatient Prescriptions on File Prior to Visit  Medication Sig Dispense Refill  . levothyroxine (SYNTHROID, LEVOTHROID) 25 MCG tablet Take 1 tablet (25 mcg total) by mouth daily.  90 tablet  3  .  MINASTRIN 24 FE 1-20 MG-MCG(24) CHEW CHEW AND SWALLOW 1 TABLET DAILY  84 tablet  1  . sertraline (ZOLOFT) 50 MG tablet Take 1 tablet (50 mg total) by mouth daily.  90 tablet  3   No current facility-administered medications on file prior to visit.    Review of Systems Review of Systems  Constitutional: Negative for fever, appetite change, fatigue and unexpected weight change.  Eyes: Negative for pain and visual disturbance.  Respiratory: Negative for cough and shortness of breath.   Cardiovascular: Negative for cp or palpitations    Gastrointestinal: Negative for nausea, diarrhea and constipation.  Genitourinary: Negative for urgency and frequency.  Skin: Negative for pallor or rash   MSK pos for back pain  Neurological: Negative for weakness, light-headedness, numbness and headaches.  Hematological: Negative for adenopathy. Does not bruise/bleed easily.  Psychiatric/Behavioral: Negative for dysphoric mood. The patient is not nervous/anxious.          Objective:   Physical Exam  Constitutional: She appears well-developed and well-nourished. No distress.  HENT:  Head: Normocephalic and atraumatic.  Mouth/Throat: Oropharynx is clear and moist.  Eyes: Conjunctivae and EOM are normal. Pupils are equal, round, and reactive to light.  Neck: Normal range of motion. Neck supple.  Cardiovascular: Normal rate, regular rhythm, normal heart sounds and intact distal pulses.   Pulmonary/Chest: Effort normal and breath sounds normal. No respiratory distress. She has no wheezes. She has no rales.  Musculoskeletal: She exhibits tenderness. She exhibits no edema.  Tender in L thoracic musculature (esp rhomboid area) and L trapezius  No spinal tenderness Nl rom of neck and spine   Some reprod of pain on rhomboid stretch Nl rom shoulder -neg hawking and neer tests  Lymphadenopathy:    She has no cervical adenopathy.  Neurological: She is alert. She has normal reflexes. No cranial nerve deficit. She exhibits normal muscle tone. Coordination normal.  Skin: Skin is warm and dry. No rash noted. No erythema. No pallor.  Psychiatric: She has a normal mood and affect.          Assessment & Plan:   Problem List Items Addressed This Visit     Other   Back pain, thoracic - Primary     This radiates up and down whole back but seems to come from L rhomboid area  Taught stretch Disc use of heat and continued walking and the correct way to carry a backpack meloxicam with food prn  Flexeril when not working or driving Update if not starting to improve in a week or if worsening      Relevant Medications      meloxicam (MOBIC) tablet      cyclobenzaprine (FLEXERIL) tablet   Other Relevant Orders      POCT urinalysis dipstick (Completed)

## 2013-10-06 NOTE — Patient Instructions (Signed)
Do the rhomboid stretch I showed you  Use heat when you can  Keep moving - you will stiffen up if still too long Use both arms for backpack-not just one  Try meloxicam with food as needed for pain  Flexeril -muscle relaxer when not working or driving  Update if not starting to improve in a week or if worsening

## 2013-10-06 NOTE — Assessment & Plan Note (Signed)
This radiates up and down whole back but seems to come from L rhomboid area  Taught stretch Disc use of heat and continued walking and the correct way to carry a backpack meloxicam with food prn  Flexeril when not working or driving Update if not starting to improve in a week or if worsening

## 2014-01-14 ENCOUNTER — Ambulatory Visit (INDEPENDENT_AMBULATORY_CARE_PROVIDER_SITE_OTHER): Payer: Managed Care, Other (non HMO) | Admitting: Family Medicine

## 2014-01-14 ENCOUNTER — Encounter: Payer: Self-pay | Admitting: *Deleted

## 2014-01-14 ENCOUNTER — Encounter: Payer: Self-pay | Admitting: Family Medicine

## 2014-01-14 VITALS — BP 100/64 | HR 108 | Temp 98.6°F | Ht 64.0 in | Wt 142.0 lb

## 2014-01-14 DIAGNOSIS — N946 Dysmenorrhea, unspecified: Secondary | ICD-10-CM | POA: Insufficient documentation

## 2014-01-14 DIAGNOSIS — E038 Other specified hypothyroidism: Secondary | ICD-10-CM

## 2014-01-14 DIAGNOSIS — E063 Autoimmune thyroiditis: Secondary | ICD-10-CM

## 2014-01-14 LAB — TSH: TSH: 1.88 u[IU]/mL (ref 0.35–5.50)

## 2014-01-14 MED ORDER — NORETHIN ACE-ETH ESTRAD-FE 1-20 MG-MCG(24) PO TABS: 1.0000 | ORAL_TABLET | Freq: Every day | ORAL | Status: DC

## 2014-01-14 NOTE — Patient Instructions (Signed)
Try the non chewable loestrin 24   1/20 This should still be generic For cramps take 2 aleve with food twice daily  If no improvement in the next several periods let me knwo   Lab today for thyroid

## 2014-01-14 NOTE — Progress Notes (Signed)
Pre visit review using our clinic review tool, if applicable. No additional management support is needed unless otherwise documented below in the visit note. 
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
## 2014-01-15 ENCOUNTER — Telehealth: Payer: Self-pay | Admitting: *Deleted

## 2014-01-15 DIAGNOSIS — E039 Hypothyroidism, unspecified: Secondary | ICD-10-CM

## 2014-01-15 MED ORDER — LEVOTHYROXINE SODIUM 25 MCG PO TABS: 25.0000 ug | ORAL_TABLET | Freq: Every day | ORAL | Status: DC

## 2014-01-15 NOTE — Telephone Encounter (Signed)
-----   Message from Judy PimpleMarne A Tower, MD sent at 01/14/2014  1:45 PM EST ----- No change needed in her levothyroxine Can refill for a year if needed

## 2014-01-15 NOTE — Telephone Encounter (Signed)
Pt notified of labs and med refilled for a yr.

## 2014-01-15 NOTE — Assessment & Plan Note (Signed)
Lab today for tsh No clinical changes

## 2014-01-15 NOTE — Assessment & Plan Note (Signed)
More problems since her generic loestrin fe 24 was changed to a different generic/chewable  Will re write for non chewable loestrin fe 24 generic-hope this will improve  Doubt she could afford the name brand  Disc use of aleve 2 pills bid with food as needed for menstrual cramps  Update if no improvement

## 2014-06-14 ENCOUNTER — Encounter (HOSPITAL_COMMUNITY): Payer: Self-pay | Admitting: Emergency Medicine

## 2014-06-14 ENCOUNTER — Emergency Department (INDEPENDENT_AMBULATORY_CARE_PROVIDER_SITE_OTHER)
Admission: EM | Admit: 2014-06-14 | Discharge: 2014-06-14 | Disposition: A | Discharge status: Home / Self Care | Payer: Managed Care, Other (non HMO) | Source: Home / Self Care | Attending: Emergency Medicine | Admitting: Emergency Medicine

## 2014-06-14 DIAGNOSIS — R51 Headache: Secondary | ICD-10-CM | POA: Diagnosis not present

## 2014-06-14 DIAGNOSIS — R42 Dizziness and giddiness: Secondary | ICD-10-CM | POA: Diagnosis not present

## 2014-06-14 DIAGNOSIS — R519 Headache, unspecified: Secondary | ICD-10-CM

## 2014-06-14 MED ORDER — MECLIZINE HCL 25 MG PO TABS: ORAL_TABLET | ORAL | Status: DC

## 2014-06-14 MED ORDER — ONDANSETRON HCL 4 MG PO TABS: 4.0000 mg | ORAL_TABLET | Freq: Four times a day (QID) | ORAL | Status: DC

## 2014-06-14 NOTE — ED Provider Notes (Signed)
CSN: [id number]     Arrival date & time 06/14/14  1821 History   First MD Initiated Contact with Patient 06/14/14 1933     Chief Complaint  Patient presents with  . Headache  . Dizziness   (Consider location/radiation/quality/duration/timing/severity/associated sxs/prior Treatment) HPI Comments: 19 year old female states that she was in church this morning and began to feel dizzy. She states  the dizziness is worse when she turns her head or changes positions. She is also complaining of occasional blurriness of vision. The headache is primarily frontal and bitemporal. She has had nausea and 2 episodes of vomiting. Denies problems with  double vision, speech, hearing, swallowing, focal paresthesias or weakness. She has minor photophobia is able to tolerate eye exam well and the lights on in the room.   Patient is a 19 y.o. female presenting with headaches and dizziness.  Headache Associated symptoms: dizziness, nausea, photophobia and vomiting   Associated symptoms: no congestion, no drainage, no ear pain, no eye pain, no fever, no numbness, no seizures, no sinus pressure and no sore throat   Dizziness Associated symptoms: headaches, nausea and vomiting     Past Medical History  Diagnosis Date  . Nausea with vomiting   . Pain in joint, pelvic region and thigh     right hip pain  . Tobacco smoke exposure     positive hx of passive tobacco smoke exposure  . 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 Abdominal pain, unspecified site   . Pre-syncope 03/2008    cardiac referral for pre syncope 03/10  . Thyroiditis, autoimmune   . Goiter   . Thyrotoxicosis with Hashimoto thyroiditis   . Hypothyroidism, acquired, autoimmune   . Fatigue   . Dyspepsia   . Orthostatic hypotension    Past Surgical History  Procedure Laterality Date  . Tonsillectomy and adenoidectomy  11/2001  . Tonsillectomy and adenoidectomy     Family History  Problem Relation Age of Onset  . Migraines  Mother   . Thyroid disease Mother   . Heart disease Maternal Grandmother   . Diabetes Maternal Grandfather   . Diabetes Paternal Grandfather   . Thyroid disease Paternal Grandfather    History  Substance Use Topics  . Smoking status: Never Smoker   . Smokeless tobacco: Never Used  . Alcohol Use: No   OB History    No data available     Review of Systems  Constitutional: Positive for activity change. Negative for fever.  HENT: Negative for congestion, ear discharge, ear pain, postnasal drip, rhinorrhea, sinus pressure and sore throat.   Eyes: Positive for photophobia and visual disturbance. Negative for pain.  Respiratory: Negative.   Cardiovascular: Negative.   Gastrointestinal: Positive for nausea and vomiting.  Genitourinary: Negative.   Musculoskeletal: Negative.   Skin: Negative.   Neurological: Positive for dizziness and headaches. Negative for tremors, seizures, syncope, facial asymmetry, speech difficulty and numbness.  Psychiatric/Behavioral: Negative.     Allergies  Prochlorperazine edisylate  Home Medications   Prior to Admission medications   Medication Sig Start Date End Date Taking? Authorizing Provider  levothyroxine (SYNTHROID, LEVOTHROID) 25 MCG tablet Take 1 tablet (25 mcg total) by mouth daily. 01/15/14   Judy Pimple, MD  meclizine (ANTIVERT) 25 MG tablet Take one half to one tablet every 6-8 hours when necessary dizziness. May cause drowsiness. 06/14/14   Hayden Rasmussen, NP  Norethindrone Acetate-Ethinyl Estrad-FE (LOESTRIN 24 FE) 1-20 MG-MCG(24) tablet Take 1 tablet by mouth daily. 01/14/14  Judy Pimple, MD  ondansetron (ZOFRAN) 4 MG tablet Take 1 tablet (4 mg total) by mouth every 6 (six) hours. Prn nausea or vomiting 06/14/14   Hayden Rasmussen, NP  sertraline (ZOLOFT) 50 MG tablet Take 1 tablet (50 mg total) by mouth daily. 03/11/13   Judy Pimple, MD   BP 115/79 mmHg  Pulse 73  Temp(Src) 97.1 F (36.2 C) (Oral)  Resp 16  SpO2 98%  LMP  06/04/2014 Physical Exam  Constitutional: She is oriented to person, place, and time. She appears well-developed and well-nourished. No distress.  HENT:  Head: Normocephalic and atraumatic.  Mouth/Throat: Oropharynx is clear and moist. No oropharyngeal exudate.  Eyes: Conjunctivae and EOM are normal. Pupils are equal, round, and reactive to light.  Neck: Normal range of motion. Neck supple.  Cardiovascular: Normal rate and normal heart sounds.   Pulmonary/Chest: Effort normal and breath sounds normal. No respiratory distress. She has no wheezes. She has no rales.  Abdominal: Soft. There is no tenderness.  Musculoskeletal: Normal range of motion.  Lymphadenopathy:    She has no cervical adenopathy.  Neurological: She is alert and oriented to person, place, and time. She has normal strength. No cranial nerve deficit or sensory deficit. She exhibits normal muscle tone. She displays a negative Romberg sign. Coordination and gait normal. GCS eye subscore is 4. GCS verbal subscore is 5. GCS motor subscore is 6.  Reflex Scores:      Patellar reflexes are 2+ on the right side and 2+ on the left side. A little wobbly on heel to toe. Romberg with sway but maintains station. Speech clear and lucid.  Skin: Skin is warm and dry.  Psychiatric: She has a normal mood and affect.  Nursing note and vitals reviewed.   ED Course  Procedures (including critical care time) Labs Review Labs Reviewed - No data to display  Imaging Review No results found.   MDM   1. Mixed headache   2. Dizziness    Neurologic exam unremarkable. I suspect this is a mixed headache due in part to heat related condition from yesterday, tension headache and part migraine. Dizziness is consistent with inner ear disorder. We will treat with meclizine and Zofran for nausea. Recommend increase fluids to rehydrate from being outside in the heat yesterday. For worsening new symptoms or problems may return.    Hayden Rasmussen,  NP 06/14/14 2001

## 2014-06-14 NOTE — ED Notes (Signed)
C/o headache, nausea, lightheaded, and dizziness.  Onset today around 11:15 am.  Reports tylenol did not help today. Patient has a history of "migraines"   Reports having headache Friday that responded to tylenol.  Denies cold symptoms.  Patient has noticed a tender knot behind right ear

## 2014-06-14 NOTE — Discharge Instructions (Signed)
Dizziness °Dizziness is a common problem. It is a feeling of unsteadiness or light-headedness. You may feel like you are about to faint. Dizziness can lead to injury if you stumble or fall. A person of any age group can suffer from dizziness, but dizziness is more common in older adults. °CAUSES  °Dizziness can be caused by many different things, including: °· Middle ear problems. °· Standing for too long. °· Infections. °· An allergic reaction. °· Aging. °· An emotional response to something, such as the sight of blood. °· Side effects of medicines. °· Tiredness. °· Problems with circulation or blood pressure. °· Excessive use of alcohol or medicines, or illegal drug use. °· Breathing too fast (hyperventilation). °· An irregular heart rhythm (arrhythmia). °· A low red blood cell count (anemia). °· Pregnancy. °· Vomiting, diarrhea, fever, or other illnesses that cause body fluid loss (dehydration). °· Diseases or conditions such as Parkinson's disease, high blood pressure (hypertension), diabetes, and thyroid problems. °· Exposure to extreme heat. °DIAGNOSIS  °Your health care provider will ask about your symptoms, perform a physical exam, and perform an electrocardiogram (ECG) to record the electrical activity of your heart. Your health care provider may also perform other heart or blood tests to determine the cause of your dizziness. These may include: °· Transthoracic echocardiogram (TTE). During echocardiography, sound waves are used to evaluate how blood flows through your heart. °· Transesophageal echocardiogram (TEE). °· Cardiac monitoring. This allows your health care provider to monitor your heart rate and rhythm in real time. °· Holter monitor. This is a portable device that records your heartbeat and can help diagnose heart arrhythmias. It allows your health care provider to track your heart activity for several days if needed. °· Stress tests by exercise or by giving medicine that makes the heart beat  faster. °TREATMENT  °Treatment of dizziness depends on the cause of your symptoms and can vary greatly. °HOME CARE INSTRUCTIONS  °· Drink enough fluids to keep your urine clear or pale yellow. This is especially important in very hot weather. In older adults, it is also important in cold weather. °· Take your medicine exactly as directed if your dizziness is caused by medicines. When taking blood pressure medicines, it is especially important to get up slowly. °· Rise slowly from chairs and steady yourself until you feel okay. °· In the morning, first sit up on the side of the bed. When you feel okay, stand slowly while holding onto something until you know your balance is fine. °· Move your legs often if you need to stand in one place for a long time. Tighten and relax your muscles in your legs while standing. °· Have someone stay with you for 1-2 days if dizziness continues to be a problem. Do this until you feel you are well enough to stay alone. Have the person call your health care provider if he or she notices changes in you that are concerning. °· Do not drive or use heavy machinery if you feel dizzy. °· Do not drink alcohol. °SEEK IMMEDIATE MEDICAL CARE IF:  °· Your dizziness or light-headedness gets worse. °· You feel nauseous or vomit. °· You have problems talking, walking, or using your arms, hands, or legs. °· You feel weak. °· You are not thinking clearly or you have trouble forming sentences. It may take a friend or family member to notice this. °· You have chest pain, abdominal pain, shortness of breath, or sweating. °· Your vision changes. °· You notice   any bleeding.  You have side effects from medicine that seems to be getting worse rather than better. MAKE SURE YOU:   Understand these instructions.  Will watch your condition.  Will get help right away if you are not doing well or get worse. Document Released: 06/14/2000 Document Revised: 12/24/2012 Document Reviewed: 07/08/2010 [facility]  Patient Information 2015 [employer], [place]. This information is not intended to replace advice given to you by your health care provider. Make sure you discuss any questions you have with your health care provider.  Headaches, Frequently Asked Questions MIGRAINE HEADACHES Q: What is migraine? What causes it? How can I treat it? A: Generally, migraine headaches begin as a dull ache. Then they develop into a constant, throbbing, and pulsating pain. You may experience pain at the temples. You may experience pain at the front or back of one or both sides of the head. The pain is usually accompanied by a combination of:  Nausea.  Vomiting.  Sensitivity to light and noise. Some people (about 15%) experience an aura (see below) before an attack. The cause of migraine is believed to be chemical reactions in the brain. Treatment for migraine may include over-the-counter or prescription medications. It may also include self-help techniques. These include relaxation training and biofeedback.  Q: What is an aura? A: About 15% of people with migraine get an "aura". This is a sign of neurological symptoms that occur before a migraine headache. You may see wavy or jagged lines, dots, or flashing lights. You might experience tunnel vision or blind spots in one or both eyes. The aura can include visual or auditory hallucinations (something imagined). It may include disruptions in smell (such as strange odors), taste or touch. Other symptoms include:  Numbness.  A "pins and needles" sensation.  Difficulty in recalling or speaking the correct word. These neurological events may last as long as 60 minutes. These symptoms will fade as the headache begins. Q: What is a trigger? A: Certain physical or environmental factors can lead to or "trigger" a migraine. These include:  Foods.  Hormonal changes.  Weather.  Stress. It is important to remember that triggers are different for everyone. To help prevent  migraine attacks, you need to figure out which triggers affect you. Keep a headache diary. This is a good way to track triggers. The diary will help you talk to your healthcare professional about your condition. Q: Does weather affect migraines? A: Bright sunshine, hot, humid conditions, and drastic changes in barometric pressure may lead to, or "trigger," a migraine attack in some people. But studies have shown that weather does not act as a trigger for everyone with migraines. Q: What is the link between migraine and hormones? A: Hormones start and regulate many of your body's functions. Hormones keep your body in balance within a constantly changing environment. The levels of hormones in your body are unbalanced at times. Examples are during menstruation, pregnancy, or menopause. That can lead to a migraine attack. In fact, about three quarters of all women with migraine report that their attacks are related to the menstrual cycle.  Q: Is there an increased risk of stroke for migraine sufferers? A: The likelihood of a migraine attack causing a stroke is very remote. That is not to say that migraine sufferers cannot have a stroke associated with their migraines. In persons under age 73, the most common associated factor for stroke is migraine headache. But over the course of a person's normal life span, the occurrence of  migraine headache may actually be associated with a reduced risk of dying from cerebrovascular disease due to stroke.  Q: What are acute medications for migraine? A: Acute medications are used to treat the pain of the headache after it has started. Examples over-the-counter medications, NSAIDs, ergots, and triptans.  Q: What are the triptans? A: Triptans are the newest class of abortive medications. They are specifically targeted to treat migraine. Triptans are vasoconstrictors. They moderate some chemical reactions in the brain. The triptans work on receptors in your brain. Triptans  help to restore the balance of a neurotransmitter called serotonin. Fluctuations in levels of serotonin are thought to be a main cause of migraine.  Q: Are over-the-counter medications for migraine effective? A: Over-the-counter, or "OTC," medications may be effective in relieving mild to moderate pain and associated symptoms of migraine. But you should see your caregiver before beginning any treatment regimen for migraine.  Q: What are preventive medications for migraine? A: Preventive medications for migraine are sometimes referred to as "prophylactic" treatments. They are used to reduce the frequency, severity, and length of migraine attacks. Examples of preventive medications include antiepileptic medications, antidepressants, beta-blockers, calcium channel blockers, and NSAIDs (nonsteroidal anti-inflammatory drugs). Q: Why are anticonvulsants used to treat migraine? A: During the past few years, there has been an increased interest in antiepileptic drugs for the prevention of migraine. They are sometimes referred to as "anticonvulsants". Both epilepsy and migraine may be caused by similar reactions in the brain.  Q: Why are antidepressants used to treat migraine? A: Antidepressants are typically used to treat people with depression. They may reduce migraine frequency by regulating chemical levels, such as serotonin, in the brain.  Q: What alternative therapies are used to treat migraine? A: The term "alternative therapies" is often used to describe treatments considered outside the scope of conventional Western medicine. Examples of alternative therapy include acupuncture, acupressure, and yoga. Another common alternative treatment is herbal therapy. Some herbs are believed to relieve headache pain. Always discuss alternative therapies with your caregiver before proceeding. Some herbal products contain arsenic and other toxins. TENSION HEADACHES Q: What is a tension-type headache? What causes it?  How can I treat it? A: Tension-type headaches occur randomly. They are often the result of temporary stress, anxiety, fatigue, or anger. Symptoms include soreness in your temples, a tightening band-like sensation around your head (a "vice-like" ache). Symptoms can also include a pulling feeling, pressure sensations, and contracting head and neck muscles. The headache begins in your forehead, temples, or the back of your head and neck. Treatment for tension-type headache may include over-the-counter or prescription medications. Treatment may also include self-help techniques such as relaxation training and biofeedback. CLUSTER HEADACHES Q: What is a cluster headache? What causes it? How can I treat it? A: Cluster headache gets its name because the attacks come in groups. The pain arrives with little, if any, warning. It is usually on one side of the head. A tearing or bloodshot eye and a runny nose on the same side of the headache may also accompany the pain. Cluster headaches are believed to be caused by chemical reactions in the brain. They have been described as the most severe and intense of any headache type. Treatment for cluster headache includes prescription medication and oxygen. SINUS HEADACHES Q: What is a sinus headache? What causes it? How can I treat it? A: When a cavity in the bones of the face and skull (a sinus) becomes inflamed, the inflammation will cause localized pain.  This condition is usually the result of an allergic reaction, a tumor, or an infection. If your headache is caused by a sinus blockage, such as an infection, you will probably have a fever. An x-ray will confirm a sinus blockage. Your caregiver's treatment might include antibiotics for the infection, as well as antihistamines or decongestants.  REBOUND HEADACHES Q: What is a rebound headache? What causes it? How can I treat it? A: A pattern of taking acute headache medications too often can lead to a condition known as  "rebound headache." A pattern of taking too much headache medication includes taking it more than 2 days per week or in excessive amounts. That means more than the label or a caregiver advises. With rebound headaches, your medications not only stop relieving pain, they actually begin to cause headaches. Doctors treat rebound headache by tapering the medication that is being overused. Sometimes your caregiver will gradually substitute a different type of treatment or medication. Stopping may be a challenge. Regularly overusing a medication increases the potential for serious side effects. Consult a caregiver if you regularly use headache medications more than 2 days per week or more than the label advises. ADDITIONAL QUESTIONS AND ANSWERS Q: What is biofeedback? A: Biofeedback is a self-help treatment. Biofeedback uses special equipment to monitor your body's involuntary physical responses. Biofeedback monitors:  Breathing.  Pulse.  Heart rate.  Temperature.  Muscle tension.  Brain activity. Biofeedback helps you refine and perfect your relaxation exercises. You learn to control the physical responses that are related to stress. Once the technique has been mastered, you do not need the equipment any more. Q: Are headaches hereditary? A: Four out of five (80%) of people that suffer report a family history of migraine. Scientists are not sure if this is genetic or a family predisposition. Despite the uncertainty, a child has a 50% chance of having migraine if one parent suffers. The child has a 75% chance if both parents suffer.  Q: Can children get headaches? A: By the time they reach high school, most young people have experienced some type of headache. Many safe and effective approaches or medications can prevent a headache from occurring or stop it after it has begun.  Q: What type of doctor should I see to diagnose and treat my headache? A: Start with your primary caregiver. Discuss his or  her experience and approach to headaches. Discuss methods of classification, diagnosis, and treatment. Your caregiver may decide to recommend you to a headache specialist, depending upon your symptoms or other physical conditions. Having diabetes, allergies, etc., may require a more comprehensive and inclusive approach to your headache. The National Headache Foundation will provide, upon request, a list of [facility] physician members in your state. Document Released: 03/11/2003 Document Revised: 03/13/2011 Document Reviewed: 08/19/2007 [facility] Patient Information 2015 [place], [place]. This information is not intended to replace advice given to you by your health care provider. Make sure you discuss any questions you have with your health care provider.  Migraine Headache A migraine headache is very bad, throbbing pain on one or both sides of your head. Talk to your doctor about what things may bring on (trigger) your migraine headaches. HOME CARE  Only take medicines as told by your doctor.  Lie down in a dark, quiet room when you have a migraine.  Keep a journal to find out if certain things bring on migraine headaches. For example, write down:  What you eat and drink.  How much sleep you get.  Any change to your diet or medicines.  Lessen how much alcohol you drink.  Quit smoking if you smoke.  Get enough sleep.  Lessen any stress in your life.  Keep lights dim if bright lights bother you or make your migraines worse. GET HELP RIGHT AWAY IF:   Your migraine becomes really bad.  You have a fever.  You have a stiff neck.  You have trouble seeing.  Your muscles are weak, or you lose muscle control.  You lose your balance or have trouble walking.  You feel like you will pass out (faint), or you pass out.  You have really bad symptoms that are different than your first symptoms. MAKE SURE YOU:   Understand these instructions.  Will watch your condition.  Will get help  right away if you are not doing well or get worse. Document Released: 09/28/2007 Document Revised: 03/13/2011 Document Reviewed: 08/26/2012 [facility] Patient Information 2015 [place], [place]. This information is not intended to replace advice given to you by your health care provider. Make sure you discuss any questions you have with your health care provider.

## 2014-12-01 ENCOUNTER — Ambulatory Visit (INDEPENDENT_AMBULATORY_CARE_PROVIDER_SITE_OTHER): Payer: Managed Care, Other (non HMO) | Admitting: Family Medicine

## 2014-12-01 ENCOUNTER — Encounter: Payer: Self-pay | Admitting: Family Medicine

## 2014-12-01 VITALS — BP 120/84 | HR 99 | Temp 98.4°F | Ht 64.0 in | Wt 132.5 lb

## 2014-12-01 DIAGNOSIS — H6120 Impacted cerumen, unspecified ear: Secondary | ICD-10-CM | POA: Insufficient documentation

## 2014-12-01 DIAGNOSIS — N946 Dysmenorrhea, unspecified: Secondary | ICD-10-CM | POA: Diagnosis not present

## 2014-12-01 DIAGNOSIS — H6123 Impacted cerumen, bilateral: Secondary | ICD-10-CM

## 2014-12-01 DIAGNOSIS — E038 Other specified hypothyroidism: Secondary | ICD-10-CM | POA: Diagnosis not present

## 2014-12-01 DIAGNOSIS — E063 Autoimmune thyroiditis: Secondary | ICD-10-CM

## 2014-12-01 LAB — TSH: TSH: 2.93 u[IU]/mL (ref 0.35–4.50)

## 2014-12-01 NOTE — Assessment & Plan Note (Addendum)
Simple irrigation today- resolved problem  Nl app TMS and hearing improved Disc cerumen and use of otc products prn to loosen it  Update if no further improvement

## 2014-12-01 NOTE — Patient Instructions (Addendum)
Stop at check out for referral to gyn  Labs for thyroid today   Ears look good after irrigation  If symptoms return let me know  For congestion you can try flonase nasal spray over the counter

## 2014-12-01 NOTE — Progress Notes (Signed)
Pre visit review using our clinic review tool, if applicable. No additional management support is needed unless otherwise documented below in the visit note. 

## 2014-12-01 NOTE — Progress Notes (Signed)
Subjective:    Patient ID: Mary Crawford, female    DOB: 03/16/1995, 19 y.o.   MRN: [id number]  HPI Here for f/u of thyroid and chronic med problems as well as new ear symptoms   Lab Results  Component Value Date   TSH 1.88 01/14/2014   Feels great  Wants a check and needs a refill on her thyroid med  No change in goiter   R ear is bothering her  Hurt when chewing on wed  Put drops in it -over the counter - something for ear pain (? Name)  Now cannot hear wall out of it  Has had a few headaches  Has had some nasal congestion - yellow mucous  Has not noticed jaw pain   Wants to get a referral to gyn  Still on loestrin fe  Cramps are getting worse- worried about endometriosis - runs in her family  Would like to go to [facility] gyn   Patient Active Problem List   Diagnosis Date Noted  . Cerumen impaction 12/01/2014  . Dysmenorrhea 01/14/2014  . Back pain, thoracic 10/06/2013  . School health examination 05/28/2013  . Migraine without aura 05/28/2013  . Depression 02/10/2013  . Uses oral contraception 10/14/2012  . Well adolescent visit 06/19/2011  . Goiter   . History of Hashimoto thyroiditis   . Hypothyroidism, acquired, autoimmune   . Orthostatic hypotension   . HYPERTHYROIDISM 12/18/2007  . CARRIER OR SUSPECTED CARRIER OTHER STREPTOCOCCUS 02/04/2007   Past Medical History  Diagnosis Date  . Nausea with vomiting   . Pain in joint, pelvic region and thigh     right hip pain  . Tobacco smoke exposure     positive hx of passive tobacco smoke exposure  . 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 Abdominal pain, unspecified site   . Pre-syncope 03/2008    cardiac referral for pre syncope 03/10  . Thyroiditis, autoimmune   . Goiter   . Thyrotoxicosis with Hashimoto thyroiditis   . Hypothyroidism, acquired, autoimmune   . Fatigue   . Dyspepsia   . Orthostatic hypotension    Past Surgical History  Procedure Laterality Date  . Tonsillectomy and  adenoidectomy  11/2001  . Tonsillectomy and adenoidectomy     Social History  Substance Use Topics  . Smoking status: Never Smoker   . Smokeless tobacco: Never Used  . Alcohol Use: 0.0 oz/week    0 Standard drinks or equivalent per week     Comment: occ   Family History  Problem Relation Age of Onset  . Migraines Mother   . Thyroid disease Mother   . Heart disease Maternal Grandmother   . Diabetes Maternal Grandfather   . Diabetes Paternal Grandfather   . Thyroid disease Paternal Grandfather    Allergies  Allergen Reactions  . Prochlorperazine Edisylate     REACTION: anxiety   Current Outpatient Prescriptions on File Prior to Visit  Medication Sig Dispense Refill  . levothyroxine (SYNTHROID, LEVOTHROID) 25 MCG tablet Take 1 tablet (25 mcg total) by mouth daily. 90 tablet 3  . Norethindrone Acetate-Ethinyl Estrad-FE (LOESTRIN 24 FE) 1-20 MG-MCG(24) tablet Take 1 tablet by mouth daily. 1 Package 11   No current facility-administered medications on file prior to visit.    Review of Systems    Review of Systems  Constitutional: Negative for fever, appetite change, fatigue and unexpected weight change.  ENT pos for ear fullness and slt nasal cong/neg for ST or ear drainage  Eyes: Negative for pain and visual disturbance.  Respiratory: Negative for cough and shortness of breath.   Cardiovascular: Negative for cp or palpitations    Gastrointestinal: Negative for nausea, diarrhea and constipation.  Genitourinary: Negative for urgency and frequency. pos for worsening menstrual pain/cramps  Skin: Negative for pallor or rash   Neurological: Negative for weakness, light-headedness, numbness and headaches.  Hematological: Negative for adenopathy. Does not bruise/bleed easily.  Psychiatric/Behavioral: Negative for dysphoric mood. The patient is not nervous/anxious.      Objective:   Physical Exam  Constitutional: She appears well-nourished. No distress.  Well appearing   HENT:    Head: Normocephalic.  Mouth/Throat: Oropharynx is clear and moist.  Nares are boggy   TMs - bilat obsc by cerumen impaction  Resolved with simple ear irrigation- nl app TMs bilat and improved hearing   No sinus tenderness  Eyes: Conjunctivae and EOM are normal. Pupils are equal, round, and reactive to light. Right eye exhibits no discharge. Left eye exhibits no discharge.  Neck: Normal range of motion. Neck supple. Thyromegaly present.  Cardiovascular: Normal rate, regular rhythm and normal heart sounds.   Pulmonary/Chest: Effort normal and breath sounds normal. No respiratory distress. She has no wheezes. She has no rales.  Abdominal: Soft. Bowel sounds are normal. She exhibits no distension and no mass. There is no tenderness. There is no rebound and no guarding.  slt suprapubic tenderness without fullness  Musculoskeletal: She exhibits no edema.  Lymphadenopathy:    She has no cervical adenopathy.  Neurological: She is alert. She has normal reflexes. She displays no tremor. No cranial nerve deficit. She exhibits normal muscle tone. Coordination normal.  Skin: Skin is warm and dry. No rash noted. No pallor.  Psychiatric: She has a normal mood and affect.          Assessment & Plan:   Problem List Items Addressed This Visit      Endocrine   Hypothyroidism, acquired, autoimmune - Primary    Due for TSH No clinical changes        Relevant Orders   TSH     Nervous and Auditory   Cerumen impaction    Simple irrigation today- resolved problem  Nl app TMS and hearing improved Disc cerumen and use of otc products prn to loosen it  Update if no further improvement        Genitourinary   Dysmenorrhea    Worsening despite her OC  Ref to gyn for further eval and also annual exam  She has endometriosis in the family      Relevant Orders   Ambulatory referral to Gynecology

## 2014-12-01 NOTE — Assessment & Plan Note (Signed)
Worsening despite her OC  Ref to gyn for further eval and also annual exam  She has endometriosis in the family

## 2014-12-01 NOTE — Assessment & Plan Note (Signed)
Due for TSH No clinical changes  

## 2014-12-02 ENCOUNTER — Telehealth: Payer: Self-pay | Admitting: *Deleted

## 2014-12-02 DIAGNOSIS — E039 Hypothyroidism, unspecified: Secondary | ICD-10-CM

## 2014-12-02 MED ORDER — LEVOTHYROXINE SODIUM 25 MCG PO TABS: 25.0000 ug | ORAL_TABLET | Freq: Every day | ORAL | Status: DC

## 2014-12-02 NOTE — Telephone Encounter (Signed)
Pt notified of lab results and Dr. Royden Purlower's comments, med refilled

## 2014-12-02 NOTE — Telephone Encounter (Signed)
-----   Message from Judy PimpleMarne A Tower, MD sent at 12/01/2014 10:07 PM EST ----- tsh is ok  No change in dose Refill thyroid med for a year if needed

## 2014-12-14 ENCOUNTER — Encounter: Payer: Self-pay | Admitting: Family Medicine

## 2014-12-14 ENCOUNTER — Ambulatory Visit (INDEPENDENT_AMBULATORY_CARE_PROVIDER_SITE_OTHER): Payer: Managed Care, Other (non HMO) | Admitting: Family Medicine

## 2014-12-14 VITALS — BP 112/71 | HR 101 | Resp 18 | Ht 64.0 in | Wt 134.0 lb

## 2014-12-14 DIAGNOSIS — N946 Dysmenorrhea, unspecified: Secondary | ICD-10-CM

## 2014-12-14 NOTE — Progress Notes (Signed)
Pt here today c/o painful menstrual cramps during the week before and during her cycle.

## 2014-12-14 NOTE — Assessment & Plan Note (Signed)
She desires definitive diagnosis. Will proceed with diagnostic laparoscopy. Risks include but are not limited to bleeding, infection, injury to surrounding structures, including bowel, bladder and ureters, blood clots, and death.  Likelihood of success is moderate at achieving diagnosis.

## 2014-12-14 NOTE — Progress Notes (Signed)
    Subjective:    Patient ID: Mary Crawford is a 19 y.o. female presenting with Dysmenorrhea  on 12/14/2014  HPI: Has been on several different types of birth control and it worked well. Recent change to Loestrin 24 and this has not worked well at all.  Reports cramping 1 wk prior to and during the week of cycle. Tries Midol and tylenol which do not work well.  Has tried heat and that does not work. She reports pain is on both sides. Has family h/o endometriosis.  Review of Systems  Constitutional: Negative for fever and chills.  Respiratory: Negative for shortness of breath.   Cardiovascular: Negative for chest pain.  Gastrointestinal: Negative for nausea, vomiting and abdominal pain.  Genitourinary: Negative for dysuria.  Skin: Negative for rash.      Objective:    BP 112/71 mmHg  Pulse 101  Resp 18  Ht 5\' 4"  (1.626 m)  Wt 134 lb (60.782 kg)  BMI 22.99 kg/m2  LMP 11/11/2014 Physical Exam  Constitutional: She is oriented to person, place, and time. She appears well-developed and well-nourished. No distress.  HENT:  Head: Normocephalic and atraumatic.  Eyes: No scleral icterus.  Neck: Neck supple.  Cardiovascular: Normal rate.   Pulmonary/Chest: Effort normal.  Abdominal: Soft.  Neurological: She is alert and oriented to person, place, and time.  Skin: Skin is warm and dry.  Psychiatric: She has a normal mood and affect.        Assessment & Plan:   Problem List Items Addressed This Visit      Unprioritized   Dysmenorrhea - Primary    She desires definitive diagnosis. Will proceed with diagnostic laparoscopy. Risks include but are not limited to bleeding, infection, injury to surrounding structures, including bowel, bladder and ureters, blood clots, and death.  Likelihood of success is moderate at achieving diagnosis.          Total face-to-face time with patient: 30 minutes. Over 50% of encounter was spent on counseling and coordination of care. Return in  about 3 months (around 03/14/2015).  Clifford Benninger S 12/14/2014 1:51 PM

## 2014-12-14 NOTE — Patient Instructions (Signed)

## 2014-12-15 ENCOUNTER — Encounter (HOSPITAL_COMMUNITY): Payer: Self-pay | Admitting: *Deleted

## 2014-12-24 DIAGNOSIS — R102 Pelvic and perineal pain: Secondary | ICD-10-CM

## 2014-12-24 NOTE — H&P (Signed)
  Mary Crawford is an 19 y.o. G0P0000 female.   Chief Complaint: Pelvic pain HPI: Has been on several different types of birth control and it worked well. Recent change to Loestrin 24 and this has not worked well at all. Reports cramping 1 wk prior to and during the week of cycle. Tries Midol and tylenol which do not work well. Has tried heat and that does not work. She reports pain is on both sides. Has family h/o endometriosis and would like attempt at diagnosis.  Past Medical History  Diagnosis Date  . Nausea with vomiting   . Pain in joint, pelvic region and thigh     right hip pain  . Tobacco smoke exposure     positive hx of passive tobacco smoke exposure  . 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 Abdominal pain, unspecified site   . Pre-syncope 03/2008    cardiac referral for pre syncope 03/10  . Thyroiditis, autoimmune   . Goiter   . Thyrotoxicosis with Hashimoto thyroiditis   . Hypothyroidism, acquired, autoimmune   . Fatigue   . Dyspepsia   . Orthostatic hypotension     Past Surgical History  Procedure Laterality Date  . Tonsillectomy and adenoidectomy  11/2001  . Tonsillectomy and adenoidectomy      Family History  Problem Relation Age of Onset  . Migraines Mother   . Thyroid disease Mother   . Heart disease Maternal Grandmother   . Diabetes Maternal Grandfather   . Diabetes Paternal Grandfather   . Thyroid disease Paternal Grandfather    Social History:  reports that she has never smoked. She has never used smokeless tobacco. She reports that she drinks alcohol. She reports that she does not use illicit drugs.   Allergies  Allergen Reactions  . Prochlorperazine Edisylate Anxiety    No current facility-administered medications on file prior to encounter.   Current Outpatient Prescriptions on File Prior to Encounter  Medication Sig Dispense Refill  . levothyroxine (SYNTHROID, LEVOTHROID) 25 MCG tablet Take 1 tablet (25 mcg total) by  mouth daily. 90 tablet 3  . Norethindrone Acetate-Ethinyl Estrad-FE (LOESTRIN 24 FE) 1-20 MG-MCG(24) tablet Take 1 tablet by mouth daily. 1 Package 11    A comprehensive review of systems was negative.  There were no vitals taken for this visit. General appearance: alert, cooperative and appears stated age Head: Normocephalic, without obvious abnormality, atraumatic Neck: supple, symmetrical, trachea midline Lungs: normal effort Heart: regular rate and rhythm Abdomen: soft, non-tender; bowel sounds normal; no masses,  no organomegaly Extremities: extremities normal, atraumatic, no cyanosis or edema Skin: Skin color, texture, turgor normal. No rashes or lesions Neurologic: Grossly normal   Lab Results  Component Value Date   WBC 5.6 01/19/2013   HGB 14.3 01/19/2013   HCT 38.9 01/19/2013   MCV 85.9 01/19/2013   PLT 238 01/19/2013   Lab Results  Component Value Date   PREGTESTUR NEGATIVE 01/19/2013     Assessment/Plan Principal Problem:   Dysmenorrhea Active Problems:   Pelvic pain in female  For diagnostic laparoscopy with biopsies Risks include but are not limited to bleeding, infection, injury to surrounding structures, including bowel, bladder and ureters, blood clots, and death.  Likelihood of success is high.    Ulla Mckiernan S 12/24/2014, 5:15 PM

## 2014-12-25 NOTE — Patient Instructions (Signed)
Your procedure is scheduled on:  Tuesday, Jan. 3, 2016  [facility] at:  6:00 A.M.  Pick up the phone at the desk and dial [phone].  Call this number if you have problems the morning of surgery: [phone].  Remember: Do NOT eat food or drink after:  Midnight Monday, Jan. 2, 2016 Take these medicines the morning of surgery with a SIP OF WATER:  Synthroid  Do NOT wear jewelry (body piercing), metal hair clips/bobby pins, make-up, or nail polish. Do NOT wear lotions, powders, or perfumes.  You may wear deoderant. Do NOT shave for 48 hours prior to surgery. Do NOT bring valuables to the hospital. Contacts, dentures, or bridgework may not be worn into surgery. Have a responsible adult drive you home and stay with you for 24 hours after your procedure

## 2014-12-29 ENCOUNTER — Encounter (HOSPITAL_COMMUNITY): Payer: Self-pay

## 2014-12-29 ENCOUNTER — Encounter (HOSPITAL_COMMUNITY)
Admission: RE | Admit: 2014-12-29 | Discharge: 2014-12-29 | Disposition: A | Discharge status: Home / Self Care | Payer: Managed Care, Other (non HMO) | Source: Ambulatory Visit | Attending: Family Medicine | Admitting: Family Medicine

## 2014-12-29 DIAGNOSIS — N946 Dysmenorrhea, unspecified: Secondary | ICD-10-CM | POA: Diagnosis not present

## 2014-12-29 DIAGNOSIS — Z01818 Encounter for other preprocedural examination: Secondary | ICD-10-CM | POA: Diagnosis not present

## 2014-12-29 HISTORY — DX: Depression, unspecified: F32.A

## 2014-12-29 HISTORY — DX: Headache: R51

## 2014-12-29 HISTORY — DX: Major depressive disorder, single episode, unspecified: F32.9

## 2014-12-29 HISTORY — DX: Headache, unspecified: R51.9

## 2014-12-29 LAB — CBC
HCT: 39.6 % (ref 36.0–46.0)
HEMOGLOBIN: 14.1 g/dL (ref 12.0–15.0)
MCH: 30.8 pg (ref 26.0–34.0)
MCHC: 35.6 g/dL (ref 30.0–36.0)
MCV: 86.5 fL (ref 78.0–100.0)
Platelets: 284 10*3/uL (ref 150–400)
RBC: 4.58 MIL/uL (ref 3.87–5.11)
RDW: 12.3 % (ref 11.5–15.5)
WBC: 8.2 10*3/uL (ref 4.0–10.5)

## 2015-01-05 ENCOUNTER — Ambulatory Visit (HOSPITAL_COMMUNITY): Payer: Managed Care, Other (non HMO) | Admitting: Anesthesiology

## 2015-01-05 ENCOUNTER — Encounter (HOSPITAL_COMMUNITY)
Admission: RE | Disposition: A | Discharge status: Home / Self Care | Payer: Self-pay | Source: Ambulatory Visit | Attending: Family Medicine

## 2015-01-05 ENCOUNTER — Ambulatory Visit (HOSPITAL_COMMUNITY)
Admission: RE | Admit: 2015-01-05 | Discharge: 2015-01-05 | Disposition: A | Discharge status: Home / Self Care | Payer: Managed Care, Other (non HMO) | Source: Ambulatory Visit | Attending: Family Medicine | Admitting: Family Medicine

## 2015-01-05 DIAGNOSIS — R102 Pelvic and perineal pain: Secondary | ICD-10-CM | POA: Diagnosis not present

## 2015-01-05 DIAGNOSIS — N946 Dysmenorrhea, unspecified: Secondary | ICD-10-CM | POA: Diagnosis present

## 2015-01-05 HISTORY — PX: LAPAROSCOPY: SHX197

## 2015-01-05 LAB — PREGNANCY, URINE: Preg Test, Ur: NEGATIVE

## 2015-01-05 SURGERY — LAPAROSCOPY, DIAGNOSTIC
Anesthesia: General | Site: Abdomen

## 2015-01-05 MED ORDER — ROCURONIUM BROMIDE 100 MG/10ML IV SOLN
INTRAVENOUS | Status: AC
  Filled 2015-01-05: qty 1

## 2015-01-05 MED ORDER — GLYCOPYRROLATE 0.2 MG/ML IJ SOLN
INTRAMUSCULAR | Status: AC
  Filled 2015-01-05: qty 3

## 2015-01-05 MED ORDER — HYDROMORPHONE HCL 1 MG/ML IJ SOLN
INTRAMUSCULAR | Status: AC
  Filled 2015-01-05: qty 1

## 2015-01-05 MED ORDER — SUCCINYLCHOLINE CHLORIDE 20 MG/ML IJ SOLN
INTRAMUSCULAR | Status: AC
  Filled 2015-01-05: qty 1

## 2015-01-05 MED ORDER — OXYCODONE HCL 5 MG PO TABS: 5.0000 mg | ORAL_TABLET | Freq: Once | ORAL | Status: DC | PRN

## 2015-01-05 MED ORDER — PROPOFOL 10 MG/ML IV BOLUS
INTRAVENOUS | Status: AC
  Filled 2015-01-05: qty 20

## 2015-01-05 MED ORDER — LIDOCAINE HCL (CARDIAC) 20 MG/ML IV SOLN
INTRAVENOUS | Status: DC | PRN
  Administered 2015-01-05: 50 mg via INTRAVENOUS

## 2015-01-05 MED ORDER — NEOSTIGMINE METHYLSULFATE 10 MG/10ML IV SOLN
INTRAVENOUS | Status: AC
  Filled 2015-01-05: qty 1

## 2015-01-05 MED ORDER — MIDAZOLAM HCL 2 MG/2ML IJ SOLN
INTRAMUSCULAR | Status: AC
  Filled 2015-01-05: qty 2

## 2015-01-05 MED ORDER — LIDOCAINE HCL (CARDIAC) 20 MG/ML IV SOLN
INTRAVENOUS | Status: AC
  Filled 2015-01-05: qty 5

## 2015-01-05 MED ORDER — ROCURONIUM BROMIDE 100 MG/10ML IV SOLN
INTRAVENOUS | Status: DC | PRN
  Administered 2015-01-05: 15 mg via INTRAVENOUS
  Administered 2015-01-05: 10 mg via INTRAVENOUS
  Administered 2015-01-05: 5 mg via INTRAVENOUS

## 2015-01-05 MED ORDER — FENTANYL CITRATE (PF) 250 MCG/5ML IJ SOLN
INTRAMUSCULAR | Status: AC
  Filled 2015-01-05: qty 5

## 2015-01-05 MED ORDER — DEXAMETHASONE SODIUM PHOSPHATE 10 MG/ML IJ SOLN
INTRAMUSCULAR | Status: AC
  Filled 2015-01-05: qty 1

## 2015-01-05 MED ORDER — HYDROMORPHONE HCL 1 MG/ML IJ SOLN
0.2500 mg | INTRAMUSCULAR | Status: DC | PRN
  Administered 2015-01-05: 0.5 mg via INTRAVENOUS

## 2015-01-05 MED ORDER — SCOPOLAMINE 1 MG/3DAYS TD PT72
1.0000 | MEDICATED_PATCH | Freq: Once | TRANSDERMAL | Status: DC
  Administered 2015-01-05: 1.5 mg via TRANSDERMAL

## 2015-01-05 MED ORDER — OXYCODONE-ACETAMINOPHEN 5-325 MG PO TABS: 1.0000 | ORAL_TABLET | Freq: Four times a day (QID) | ORAL | Status: DC | PRN

## 2015-01-05 MED ORDER — KETOROLAC TROMETHAMINE 30 MG/ML IJ SOLN
INTRAMUSCULAR | Status: DC | PRN
  Administered 2015-01-05: 30 mg via INTRAVENOUS

## 2015-01-05 MED ORDER — LACTATED RINGERS IV SOLN
INTRAVENOUS | Status: DC
  Administered 2015-01-05: 07:00:00 via INTRAVENOUS

## 2015-01-05 MED ORDER — PROPOFOL 10 MG/ML IV BOLUS
INTRAVENOUS | Status: DC | PRN
  Administered 2015-01-05: 150 mg via INTRAVENOUS
  Administered 2015-01-05: 50 mg via INTRAVENOUS

## 2015-01-05 MED ORDER — ONDANSETRON HCL 4 MG/2ML IJ SOLN
INTRAMUSCULAR | Status: DC | PRN
  Administered 2015-01-05: 4 mg via INTRAVENOUS

## 2015-01-05 MED ORDER — BUPIVACAINE HCL (PF) 0.25 % IJ SOLN
INTRAMUSCULAR | Status: DC | PRN
  Administered 2015-01-05: 5 mL

## 2015-01-05 MED ORDER — SCOPOLAMINE 1 MG/3DAYS TD PT72
MEDICATED_PATCH | TRANSDERMAL | Status: AC
  Administered 2015-01-05: 1.5 mg via TRANSDERMAL
  Filled 2015-01-05: qty 1

## 2015-01-05 MED ORDER — SUCCINYLCHOLINE CHLORIDE 20 MG/ML IJ SOLN
INTRAMUSCULAR | Status: DC | PRN
  Administered 2015-01-05: 100 mg via INTRAVENOUS

## 2015-01-05 MED ORDER — BUPIVACAINE HCL (PF) 0.25 % IJ SOLN
INTRAMUSCULAR | Status: AC
  Filled 2015-01-05: qty 30

## 2015-01-05 MED ORDER — SUGAMMADEX SODIUM 200 MG/2ML IV SOLN
INTRAVENOUS | Status: DC | PRN
  Administered 2015-01-05: 200 mg via INTRAVENOUS

## 2015-01-05 MED ORDER — MIDAZOLAM HCL 2 MG/2ML IJ SOLN
INTRAMUSCULAR | Status: DC | PRN
  Administered 2015-01-05: 2 mg via INTRAVENOUS

## 2015-01-05 MED ORDER — GLYCOPYRROLATE 0.2 MG/ML IJ SOLN
INTRAMUSCULAR | Status: DC | PRN
  Administered 2015-01-05: 0.2 mg via INTRAVENOUS

## 2015-01-05 MED ORDER — DEXAMETHASONE SODIUM PHOSPHATE 10 MG/ML IJ SOLN
INTRAMUSCULAR | Status: DC | PRN
  Administered 2015-01-05: 4 mg via INTRAVENOUS

## 2015-01-05 MED ORDER — FENTANYL CITRATE (PF) 100 MCG/2ML IJ SOLN
INTRAMUSCULAR | Status: DC | PRN
  Administered 2015-01-05 (×2): 100 ug via INTRAVENOUS

## 2015-01-05 MED ORDER — OXYCODONE HCL 5 MG/5ML PO SOLN: 5.0000 mg | Freq: Once | ORAL | Status: DC | PRN

## 2015-01-05 MED ORDER — SUGAMMADEX SODIUM 200 MG/2ML IV SOLN
INTRAVENOUS | Status: AC
  Filled 2015-01-05: qty 2

## 2015-01-05 MED ORDER — MEPERIDINE HCL 25 MG/ML IJ SOLN: 6.2500 mg | INTRAMUSCULAR | Status: DC | PRN

## 2015-01-05 MED ORDER — KETOROLAC TROMETHAMINE 30 MG/ML IJ SOLN
INTRAMUSCULAR | Status: AC
  Filled 2015-01-05: qty 1

## 2015-01-05 MED ORDER — ONDANSETRON HCL 4 MG/2ML IJ SOLN
INTRAMUSCULAR | Status: AC
  Filled 2015-01-05: qty 2

## 2015-01-05 SURGICAL SUPPLY — 29 items
CABLE HIGH FREQUENCY MONO STRZ (ELECTRODE) IMPLANT
CATH ROBINSON RED A/P 16FR (CATHETERS) IMPLANT
CHLORAPREP W/TINT 26ML (MISCELLANEOUS) ×3 IMPLANT
CLOTH BEACON ORANGE TIMEOUT ST (SAFETY) ×3 IMPLANT
DRSG COVADERM PLUS 2X2 (GAUZE/BANDAGES/DRESSINGS) ×6 IMPLANT
DRSG OPSITE POSTOP 3X4 (GAUZE/BANDAGES/DRESSINGS) ×2 IMPLANT
DRSG TELFA 3X8 NADH (GAUZE/BANDAGES/DRESSINGS) ×3 IMPLANT
GLOVE BIOGEL PI IND STRL 7.0 (GLOVE) ×2 IMPLANT
GLOVE BIOGEL PI INDICATOR 7.0 (GLOVE) ×4
GLOVE ECLIPSE 7.0 STRL STRAW (GLOVE) ×6 IMPLANT
GOWN STRL REUS W/TWL LRG LVL3 (GOWN DISPOSABLE) ×9 IMPLANT
LIQUID BAND (GAUZE/BANDAGES/DRESSINGS) ×2 IMPLANT
NS IRRIG 1000ML POUR BTL (IV SOLUTION) ×3 IMPLANT
PACK LAPAROSCOPY BASIN (CUSTOM PROCEDURE TRAY) ×3 IMPLANT
PAD DRESSING TELFA 3X8 NADH (GAUZE/BANDAGES/DRESSINGS) IMPLANT
PAD POSITIONING PINK XL (MISCELLANEOUS) ×3 IMPLANT
SET IRRIG TUBING LAPAROSCOPIC (IRRIGATION / IRRIGATOR) IMPLANT
SLEEVE XCEL OPT CAN 5 100 (ENDOMECHANICALS) ×3 IMPLANT
SUT VIC AB 3-0 X1 27 (SUTURE) ×3 IMPLANT
SUT VICRYL 0 UR6 27IN ABS (SUTURE) ×6 IMPLANT
SUT VICRYL 4-0 PS2 18IN ABS (SUTURE) ×3 IMPLANT
TOWEL OR 17X24 6PK STRL BLUE (TOWEL DISPOSABLE) ×6 IMPLANT
TRAY FOLEY CATH SILVER 14FR (SET/KITS/TRAYS/PACK) ×3 IMPLANT
TROCAR BALLN 12MMX100 BLUNT (TROCAR) ×2 IMPLANT
TROCAR OPTI TIP 5M 100M (ENDOMECHANICALS) ×6 IMPLANT
TROCAR XCEL DIL TIP R 11M (ENDOMECHANICALS) IMPLANT
TROCAR XCEL NON-BLD 5MMX100MML (ENDOMECHANICALS) ×3 IMPLANT
WARMER LAPAROSCOPE (MISCELLANEOUS) ×3 IMPLANT
WATER STERILE IRR 1000ML POUR (IV SOLUTION) ×1 IMPLANT

## 2015-01-05 NOTE — Discharge Instructions (Signed)
Diagnostic Laparoscopy °A diagnostic laparoscopy is a procedure to diagnose diseases in the abdomen. During the procedure, a thin, lighted, pencil-sized instrument called a laparoscope is inserted into the abdomen through an incision. The laparoscope allows your health care provider to look at the organs inside your body. °LET YOUR HEALTH CARE PROVIDER KNOW ABOUT: °· Any allergies you have. °· All medicines you are taking, including vitamins, herbs, eye drops, creams, and over-the-counter medicines. °· Previous problems you or members of your family have had with the use of anesthetics. °· Any blood disorders you have. °· Previous surgeries you have had. °· Medical conditions you have. °RISKS AND COMPLICATIONS  °Generally, this is a safe procedure. However, problems can occur, which may include: °· Infection. °· Bleeding. °· Damage to other organs. °· Allergic reaction to the anesthetics used during the procedure. °BEFORE THE PROCEDURE °· Do not eat or drink anything after midnight on the night before the procedure or as directed by your health care provider. °· Ask your health care provider about: °¨ Changing or stopping your regular medicines. °¨ Taking medicines such as aspirin and ibuprofen. These medicines can thin your blood. Do not take these medicines before your procedure if your health care provider instructs you not to. °· Plan to have someone take you home after the procedure. °PROCEDURE °· You may be given a medicine to help you relax (sedative). °· You will be given a medicine to make you sleep (general anesthetic). °· Your abdomen will be inflated with a gas. This will make your organs easier to see. °· Small incisions will be made in your abdomen. °· A laparoscope and other small instruments will be inserted into the abdomen through the incisions. °· A tissue sample may be removed from an organ in the abdomen for examination. °· The instruments will be removed from the abdomen. °· The gas will be  released. °· The incisions will be closed with stitches (sutures). °AFTER THE PROCEDURE  °Your blood pressure, heart rate, breathing rate, and blood oxygen level will be monitored often until the medicines you were given have worn off. °  °This information is not intended to replace advice given to you by your health care provider. Make sure you discuss any questions you have with your health care provider. °  °Document Released: 03/27/2000 Document Revised: 09/09/2014 Document Reviewed: 08/01/2013 °Elsevier Interactive Patient Education ©2016 [employer] ° ° ° °DISCHARGE INSTRUCTIONS: Laparoscopy ° °The following instructions have been prepared to help you care for yourself upon your return home today. ° °Wound care: °• Do not get the incision wet for the first 24 hours. The incision should be kept clean and dry. °• The Band-Aids or dressings may be removed the day after surgery. °• Should the incision become sore, red, and swollen after the first week, check with your doctor. ° °Personal hygiene: °• Shower the day after your procedure. ° °Activity and limitations: °• Do NOT drive or operate any equipment today. °• Do NOT lift anything more than 15 pounds for 2-3 weeks after surgery. °• Do NOT rest in bed all day. °• Walking is encouraged. Walk each day, starting slowly with 5-minute walks 3 or 4 times a day. Slowly increase the length of your walks. °• Walk up and down stairs slowly. °• Do NOT do strenuous activities, such as golfing, playing tennis, bowling, running, biking, weight lifting, gardening, mowing, or vacuuming for 2-4 weeks. Ask your doctor when it is okay to start. ° °Diet: Eat a   light meal as desired this evening. You may resume your usual diet tomorrow.  Return to work: This is dependent on the type of work you do. For the most part you can return to a desk job within a week of surgery. If you are more active at work, please discuss this with your doctor.  What to expect after your surgery:  You may have a slight burning sensation when you urinate on the first day. You may have a very small amount of blood in the urine. Expect to have a small amount of vaginal discharge/light bleeding for 1-2 weeks. It is not unusual to have abdominal soreness and bruising for up to 2 weeks. You may be tired and need more rest for about 1 week. You may experience shoulder pain for 24-72 hours. Lying flat in bed may relieve it.  NO IBUPROFEN PRODUCTS (MOTRIN, ADVIL) OR ALEVE UNTIL 1:50PM TODAY.  MAY REMOVE SCOPE PATCH ON OR BEFORE 01/07/14.   Call your doctor for any of the following:  Develop a fever of 100.4 or greater  Inability to urinate 6 hours after discharge from hospital  Severe pain not relieved by pain medications  Persistent of heavy bleeding at incision site  Redness or swelling around incision site after a week  Increasing nausea or vomiting  Patient Signature________________________________________ Nurse Signature_________________________________________

## 2015-01-05 NOTE — Anesthesia Preprocedure Evaluation (Signed)
Anesthesia Evaluation  Patient identified by MRN, date of birth, ID band Patient awake    Airway Mallampati: I  TM Distance: >3 FB Neck ROM: Full    Dental  (+) Teeth Intact, Dental Advisory Given   Pulmonary    breath sounds clear to auscultation       Cardiovascular  Rhythm:Regular Rate:Normal     Neuro/Psych    GI/Hepatic   Endo/Other    Renal/GU      Musculoskeletal   Abdominal   Peds  Hematology   Anesthesia Other Findings   Reproductive/Obstetrics                             Anesthesia Physical Anesthesia Plan  ASA: I  Anesthesia Plan: General   Post-op Pain Management:    Induction: Intravenous  Airway Management Planned: Oral ETT  Additional Equipment:   Intra-op Plan:   Post-operative Plan: Extubation in OR  Informed Consent: I have reviewed the patients History and Physical, chart, labs and discussed the procedure including the risks, benefits and alternatives for the proposed anesthesia with the patient or authorized representative who has indicated his/her understanding and acceptance.   Dental advisory given  Plan Discussed with: CRNA, Anesthesiologist and Surgeon  Anesthesia Plan Comments:         Anesthesia Quick Evaluation

## 2015-01-05 NOTE — Interval H&P Note (Signed)
History and Physical Interval Note:  01/05/2015 7:12 AM  Mary QuinonesAllison M Smyser  has presented today for surgery, with the diagnosis of  Dysmenorrhea  The various methods of treatment have been discussed with the patient and family. After consideration of risks, benefits and other options for treatment, the patient has consented to  Procedure(s): LAPAROSCOPY DIAGNOSTIC (N/A) as a surgical intervention .  The patient's history has been reviewed, patient examined, no change in status, stable for surgery.  I have reviewed the patient's chart and labs.  Questions were answered to the patient's satisfaction.     Warnell Rasnic S

## 2015-01-05 NOTE — Transfer of Care (Signed)
Immediate Anesthesia Transfer of Care Note  Patient: Mary Crawford  Procedure(s) Performed: Procedure(s): LAPAROSCOPY DIAGNOSTIC (N/A)  Patient Location: PACU  Anesthesia Type:General  Level of Consciousness: awake, alert  and oriented  Airway & Oxygen Therapy: Patient Spontanous Breathing and Patient connected to nasal cannula oxygen  Post-op Assessment: Report given to RN and Post -op Vital signs reviewed and stable  Post vital signs: Reviewed and stable  Last Vitals:  Filed Vitals:   01/05/15 0635  BP: 109/73  Pulse: 77  Temp: 36.9 C  Resp: 20    Complications: No apparent anesthesia complications

## 2015-01-05 NOTE — Anesthesia Postprocedure Evaluation (Signed)
Anesthesia Post Note  Patient: Mary Crawford  Procedure(s) Performed: Procedure(s) (LRB): LAPAROSCOPY DIAGNOSTIC (N/A)  Patient location during evaluation: PACU Anesthesia Type: General Level of consciousness: awake and alert Pain management: pain level controlled Vital Signs Assessment: post-procedure vital signs reviewed and stable Respiratory status: spontaneous breathing, nonlabored ventilation and respiratory function stable Cardiovascular status: blood pressure returned to baseline and stable Postop Assessment: no signs of nausea or vomiting Anesthetic complications: no    Last Vitals:  Filed Vitals:   01/05/15 0911 01/05/15 0953  BP: 101/65 99/52  Pulse: 75 77  Temp: 36.3 C   Resp: 16 20    Last Pain:  Filed Vitals:   01/05/15 0958  PainSc: 0-No pain                 Darthula Desa A

## 2015-01-05 NOTE — Op Note (Signed)
Preoperative diagnosis: Pelvic pain, dysmenorrhea   Postoperative diagnosis: Same  Procedure: Diagnostic laparoscopy  Surgeon: Tinnie Gensanya Klyde Banka, MD  Anesthesia: Cassandria SanteeGETT- Kyle Jackson, MD  Findings: Normal appearing uterus, tubes, ovaries.  No evidence of endometriosis.  Normal anterior and posterior cul-de-sacs.  Normal liver edge, no significant adhesive disease.  Estimated blood loss: Minimal  Complications: None known  Specimens: Peritoneal biopsies  Disposition of Specimen:  Pathology  Reason for procedure: 20 y.o. G0P0000 with h/o progressive dysmenorrhea and failed OC's at controlling her symptoms, desires to know about endometriosis.  Procedure: Patient was taken to the operating room was placed in dorsal lithotomy in [place] stirrups. She was prepped and draped in the usual sterile fashion. A timeout was performed. The patient had SCDs in place. Foley catheter is used to drain bladder. Speculum was placed inside the vagina. The cervix was visualized and grasped anteriorly with a single-tooth tenaculum. An acorn tenaculum was placed through the cervix for uterine manipulation. The  speculum was removed from the vagina.  Attention was then turned to the abdomen. Five cc of 0.25% Marcaine was injected at the umbilicus. Two Allis clamps were used to tent up the skin of the umbilicus a vertical one centimeter incision was made here. The fascia was incised with the knife  And the peritoneum was entered sharply with this incision. The fascia was tagged with a 0 Vicryl suture on a UR 6 needle in a pursestring fashion. A Hassan trocar was placed through this incision and a pneumoperitoneum was created. The patient was then placed in Trendelenburg. The pelvis was inspected in a systematic fashion. The patient's right and left tubes and ovaries appeared normal. The posterior and anterior cul-de-sacs were inspected and felt to be normal. The ovarian fossa were inspected and also felt to be normal. The  appendix was inspected and felt to be normal. The upper abdomen was inspected the liver edge gallbladder and stomach appeared normal. Peritoneal biopsies were taken in the posterior, anterior cul-de-sac's and in the right pelvic side wall. There were no additional findings in the pelvis and so the procedure was terminated. All instrument, needle  and lap counts were correct x 2. The patient was awakened to recovery in stable condition.  Noeli Lavery SMD 01/05/2015 8:02 AM

## 2015-01-05 NOTE — Anesthesia Procedure Notes (Signed)
Procedure Name: Intubation Date/Time: 01/05/2015 7:30 AM Performed by: Shanon PayorGREGORY, Keeana Pieratt M Pre-anesthesia Checklist: Patient identified, Emergency Drugs available, Suction available, Timeout performed and Patient being monitored Patient Re-evaluated:Patient Re-evaluated prior to inductionOxygen Delivery Method: Circle system utilized Preoxygenation: Pre-oxygenation with 100% oxygen Intubation Type: IV induction Ventilation: Mask ventilation without difficulty Laryngoscope Size: Mac and 3 Grade View: Grade I Tube type: Oral Tube size: 7.0 mm Number of attempts: 1 Airway Equipment and Method: Stylet Placement Confirmation: ETT inserted through vocal cords under direct vision,  positive ETCO2 and breath sounds checked- equal and bilateral Secured at: 22 cm Tube secured with: Tape Dental Injury: Teeth and Oropharynx as per pre-operative assessment  Comments: Intubated by Dr. Jean RosenthalJackson, VSS atraumatic intubation.

## 2015-01-06 ENCOUNTER — Encounter (HOSPITAL_COMMUNITY): Payer: Self-pay | Admitting: Family Medicine

## 2015-01-22 ENCOUNTER — Ambulatory Visit (INDEPENDENT_AMBULATORY_CARE_PROVIDER_SITE_OTHER): Payer: Managed Care, Other (non HMO) | Admitting: Family Medicine

## 2015-01-22 ENCOUNTER — Encounter: Payer: Self-pay | Admitting: Family Medicine

## 2015-01-22 ENCOUNTER — Telehealth: Payer: Self-pay

## 2015-01-22 VITALS — BP 110/75 | HR 111 | Resp 18 | Ht 64.0 in | Wt 134.0 lb

## 2015-01-22 DIAGNOSIS — N946 Dysmenorrhea, unspecified: Secondary | ICD-10-CM

## 2015-01-22 DIAGNOSIS — Z09 Encounter for follow-up examination after completed treatment for conditions other than malignant neoplasm: Secondary | ICD-10-CM | POA: Diagnosis not present

## 2015-01-22 DIAGNOSIS — E039 Hypothyroidism, unspecified: Secondary | ICD-10-CM

## 2015-01-22 MED ORDER — LEVOTHYROXINE SODIUM 25 MCG PO TABS: 25.0000 ug | ORAL_TABLET | Freq: Every day | ORAL | Status: DC

## 2015-01-22 MED ORDER — NORGESTIMATE-ETH ESTRADIOL 0.25-35 MG-MCG PO TABS: 1.0000 | ORAL_TABLET | Freq: Every day | ORAL | Status: DC

## 2015-01-22 NOTE — Telephone Encounter (Signed)
Pt called for levothyroxine refill;advised pt Dr Shawnie Pons has already refilled med. Pt voiced understanding.

## 2015-01-22 NOTE — Progress Notes (Signed)
    Subjective:    Patient ID: Mary Crawford is a 19 y.o. female presenting with Post-op check  on 01/22/2015  HPI: Patient here for post op check following diagnostic laparoscopy for dysmenorrhea. Pathology was negative and pelvis was normal. Symptoms began following change in OC's from Lo-estrin to Minastrin, a generic.  Review of Systems  Constitutional: Negative for fever and chills.  Respiratory: Negative for shortness of breath.   Cardiovascular: Negative for chest pain.  Gastrointestinal: Negative for nausea, vomiting and abdominal pain.  Genitourinary: Negative for dysuria.  Skin: Negative for rash.      Objective:    BP 110/75 mmHg  Pulse 111  Resp 18  Ht  (1.626 m)  Wt 134 lb (60.782 kg)  BMI 22.99 kg/m2  LMP 01/14/2015 Physical Exam  Constitutional: She is oriented to person, place, and time. She appears well-developed and well-nourished. No distress.  HENT:  Head: Normocephalic and atraumatic.  Eyes: No scleral icterus.  Neck: Neck supple.  Cardiovascular: Normal rate.   Pulmonary/Chest: Effort normal.  Abdominal: Soft.  Neurological: She is alert and oriented to person, place, and time.  Skin: Skin is warm and dry.  Incision is healing well.  Psychiatric: She has a normal mood and affect.        Assessment & Plan:   Problem List Items Addressed This Visit      Unprioritized   Dysmenorrhea - Primary    Trial of Sprintec and skip placebos. If works well, we will send her rx to [facility], mail order.      Relevant Medications   norgestimate-ethinyl estradiol (ORTHO-CYCLEN,SPRINTEC,PREVIFEM) 0.25-35 MG-MCG tablet    Other Visit Diagnoses    Hypothyroidism, unspecified hypothyroidism type        med refill sent to [facility]    Relevant Medications    levothyroxine (SYNTHROID, LEVOTHROID) 25 MCG tablet    Postop check        healing well       Return if symptoms worsen or fail to improve.  Aryon Nham S 01/22/2015 9:39 AM

## 2015-01-22 NOTE — Patient Instructions (Addendum)

## 2015-01-22 NOTE — Assessment & Plan Note (Signed)
Trial of Sprintec and skip placebos. If works well, we will send her rx to [facility], mail order.

## 2015-08-10 ENCOUNTER — Ambulatory Visit (INDEPENDENT_AMBULATORY_CARE_PROVIDER_SITE_OTHER): Payer: Managed Care, Other (non HMO) | Admitting: Family Medicine

## 2015-08-10 ENCOUNTER — Encounter: Payer: Self-pay | Admitting: Family Medicine

## 2015-08-10 VITALS — BP 121/77 | HR 103 | Resp 16 | Ht 64.0 in | Wt 130.0 lb

## 2015-08-10 DIAGNOSIS — Z124 Encounter for screening for malignant neoplasm of cervix: Secondary | ICD-10-CM

## 2015-08-10 DIAGNOSIS — R102 Pelvic and perineal pain: Secondary | ICD-10-CM

## 2015-08-10 DIAGNOSIS — Z3041 Encounter for surveillance of contraceptive pills: Secondary | ICD-10-CM | POA: Diagnosis not present

## 2015-08-10 DIAGNOSIS — N946 Dysmenorrhea, unspecified: Secondary | ICD-10-CM

## 2015-08-10 DIAGNOSIS — Z01419 Encounter for gynecological examination (general) (routine) without abnormal findings: Secondary | ICD-10-CM

## 2015-08-10 MED ORDER — LEVONORGEST-ETH ESTRAD 91-DAY 0.15-0.03 &0.01 MG PO TABS: 1.0000 | ORAL_TABLET | Freq: Every day | ORAL | 4 refills | Status: DC

## 2015-08-10 NOTE — Progress Notes (Signed)
   Subjective:     Mary Crawford is a 20 y.o. female and is here for a comprehensive physical exam. The patient reports problems - dysmenorrhea. She has been on Sprintec, and skipping placebos since surgery in January. Surgery was to  Look for endometriosis, but she had none found on laparoscopy. She continues to spot frequently. She has tried multiple OC's in the past.  Pain is worse with standing and after urination. Reports some urinary frequency. Skipping placebos and has spotting, and cramping around when she should have her cycle.  Social History   Social History  . Marital status: Single    Spouse name: N/A  . Number of children: N/A  . Years of education: N/A   Occupational History  . Not on file.   Social History Main Topics  . Smoking status: Never Smoker  . Smokeless tobacco: Never Used  . Alcohol use 0.0 oz/week     Comment: occassional  . Drug use: No  . Sexual activity: No   Other Topics Concern  . Not on file   Social History Narrative   Lives with parents. 12th grade [facility] [employer].    Health Maintenance  Topic Date Due  . HIV Screening  10/11/2008  . TETANUS/TDAP  10/11/2012  . PAP SMEAR  10/12/2014  . INFLUENZA VACCINE  04/01/2021 (Originally 08/03/2015)    The following portions of the patient's history were reviewed and updated as appropriate: allergies, current medications, past family history, past medical history, past social history, past surgical history and problem list.  Review of Systems Pertinent items noted in HPI and remainder of comprehensive ROS otherwise negative.   Objective:    BP 121/77   Pulse (!) 103   Resp 16   Ht 5\' 4"  (1.626 m)   Wt 130 lb (59 kg)   LMP 07/15/2015 (Approximate)   Breastfeeding? No   BMI 22.31 kg/m  General appearance: alert, cooperative and appears stated age Head: Normocephalic, without obvious abnormality, atraumatic Neck: no adenopathy, supple, symmetrical, trachea midline and thyroid not  enlarged, symmetric, no tenderness/mass/nodules Lungs: clear to auscultation bilaterally Breasts: normal appearance, no masses or tenderness Heart: regular rate and rhythm, S1, S2 normal, no murmur, click, rub or gallop Abdomen: soft, non-tender; bowel sounds normal; no masses,  no organomegaly Pelvic: cervix normal in appearance, external genitalia normal, no adnexal masses or tenderness, no cervical motion tenderness, uterus normal size, shape, and consistency, vagina normal without discharge and hymen intact Extremities: extremities normal, atraumatic, no cyanosis or edema Pulses: 2+ and symmetric Skin: Skin color, texture, turgor normal. No rashes or lesions Lymph nodes: Cervical, supraclavicular, and axillary nodes normal. Neurologic: Grossly normal    Assessment:    Healthy female exam.      Plan:     1. Dysmenorrhea Switch to Seasonique - Levonorgestrel-Ethinyl Estradiol (AMETHIA,CAMRESE) 0.15-0.03 &0.01 MG tablet; Take 1 tablet by mouth daily.  Dispense: 1 Package; Refill: 4  2. Pelvic pain in female R/o interstitial cystitis, given issues with urination - Ambulatory referral to Urology  3. Screening for cervical cancer  - Cytology - PAP  4. Visit for routine gyn exam  Return in 6 months (on 02/10/2016).  See After Visit Summary for Counseling Recommendations

## 2015-08-10 NOTE — Patient Instructions (Signed)
Dysmenorrhea Menstrual cramps (dysmenorrhea) are caused by the muscles of the uterus tightening (contracting) during a menstrual period. For some women, this discomfort is merely bothersome. For others, dysmenorrhea can be severe enough to interfere with everyday activities for a few days each month. Primary dysmenorrhea is menstrual cramps that last a couple of days when you start having menstrual periods or soon after. This often begins after a teenager starts having her period. As a woman gets older or has a baby, the cramps will usually lessen or disappear. Secondary dysmenorrhea begins later in life, lasts longer, and the pain may be stronger than primary dysmenorrhea. The pain may start before the period and last a few days after the period.  CAUSES  Dysmenorrhea is usually caused by an underlying problem, such as:  The tissue lining the uterus grows outside of the uterus in other areas of the body (endometriosis).  The endometrial tissue, which normally lines the uterus, is found in or grows into the muscular walls of the uterus (adenomyosis).  The pelvic blood vessels are engorged with blood just before the menstrual period (pelvic congestive syndrome).  Overgrowth of cells (polyps) in the lining of the uterus or cervix.  Falling down of the uterus (prolapse) because of loose or stretched ligaments.  Depression.  Bladder problems, infection, or inflammation.  Problems with the intestine, a tumor, or irritable bowel syndrome.  Cancer of the female organs or bladder.  A severely tipped uterus.  A very tight opening or closed cervix.  Noncancerous tumors of the uterus (fibroids).  Pelvic inflammatory disease (PID).  Pelvic scarring (adhesions) from a previous surgery.  Ovarian cyst.  An intrauterine device (IUD) used for birth control. RISK FACTORS You may be at greater risk of dysmenorrhea if:  You are younger than age 62.  You started puberty early.  You have  irregular or heavy bleeding.  You have never given birth.  You have a family history of this problem.  You are a smoker. SIGNS AND SYMPTOMS   Cramping or throbbing pain in your lower abdomen.  Headaches.  Lower back pain.  Nausea or vomiting.  Diarrhea.  Sweating or dizziness.  Loose stools. DIAGNOSIS  A diagnosis is based on your history, symptoms, physical exam, diagnostic tests, or procedures. Diagnostic tests or procedures may include:  Blood tests.  Ultrasonography.  An examination of the lining of the uterus (dilation and curettage, D&C).  An examination inside your abdomen or pelvis with a scope (laparoscopy).  X-rays.  CT scan.  MRI.  An examination inside the bladder with a scope (cystoscopy).  An examination inside the intestine or stomach with a scope (colonoscopy, gastroscopy). TREATMENT  Treatment depends on the cause of the dysmenorrhea. Treatment may include:  Pain medicine prescribed by your health care provider.  Birth control pills or an IUD with progesterone hormone in it.  Hormone replacement therapy.  Nonsteroidal anti-inflammatory drugs (NSAIDs). These may help stop the production of prostaglandins.  Surgery to remove adhesions, endometriosis, ovarian cyst, or fibroids.  Removal of the uterus (hysterectomy).  Progesterone shots to stop the menstrual period.  Cutting the nerves on the sacrum that go to the female organs (presacral neurectomy).  Electric current to the sacral nerves (sacral nerve stimulation).  Antidepressant medicine.  Psychiatric therapy, counseling, or group therapy.  Exercise and physical therapy.  Meditation and yoga therapy.  Acupuncture. HOME CARE INSTRUCTIONS   Only take over-the-counter or prescription medicines as directed by your health care provider.  Place a heating pad  or hot water bottle on your lower back or abdomen. Do not sleep with the heating pad.  Use aerobic exercises, walking,  swimming, biking, and other exercises to help lessen the cramping.  Massage to the lower back or abdomen may help.  Stop smoking.  Avoid alcohol and caffeine. SEEK MEDICAL CARE IF:   Your pain does not get better with medicine.  You have pain with sexual intercourse.  Your pain increases and is not controlled with medicines.  You have abnormal vaginal bleeding with your period.  You develop nausea or vomiting with your period that is not controlled with medicine. SEEK IMMEDIATE MEDICAL CARE IF:  You pass out.    This information is not intended to replace advice given to you by your health care provider. Make sure you discuss any questions you have with your health care provider.   Document Released: 12/19/2004 Document Revised: 08/21/2012 Document Reviewed: 06/06/2012 Elsevier Interactive Patient Education 2016 [place] for Adults, Female A healthy lifestyle and preventive care can promote health and wellness. Preventive health guidelines for women include the following key practices.  A routine yearly physical is a good way to check with your health care provider about your health and preventive screening. It is a chance to share any concerns and updates on your health and to receive a thorough exam.  Visit your dentist for a routine exam and preventive care every 6 months. Brush your teeth twice a day and floss once a day. Good oral hygiene prevents tooth decay and gum disease.  The frequency of eye exams is based on your age, health, family medical history, use of contact lenses, and other factors. Follow your health care provider's recommendations for frequency of eye exams.  Eat a healthy diet. Foods like vegetables, fruits, whole grains, low-fat dairy products, and lean protein foods contain the nutrients you need without too many calories. Decrease your intake of foods high in solid fats, added sugars, and salt. Eat the right amount of calories for  you.Get information about a proper diet from your health care provider, if necessary.  Regular physical exercise is one of the most important things you can do for your health. Most adults should get at least 150 minutes of moderate-intensity exercise (any activity that increases your heart rate and causes you to sweat) each week. In addition, most adults need muscle-strengthening exercises on 2 or more days a week.  Maintain a healthy weight. The body mass index (BMI) is a screening tool to identify possible weight problems. It provides an estimate of body fat based on height and weight. Your health care provider can find your BMI and can help you achieve or maintain a healthy weight.For adults 20 years and older:  A BMI below 18.5 is considered underweight.  A BMI of 18.5 to 24.9 is normal.  A BMI of 25 to 29.9 is considered overweight.  A BMI of 30 and above is considered obese.  Maintain normal blood lipids and cholesterol levels by exercising and minimizing your intake of saturated fat. Eat a balanced diet with plenty of fruit and vegetables. Blood tests for lipids and cholesterol should begin at age 50 and be repeated every 5 years. If your lipid or cholesterol levels are high, you are over 50, or you are at high risk for heart disease, you may need your cholesterol levels checked more frequently.Ongoing high lipid and cholesterol levels should be treated with medicines if diet and exercise are not working.  If  you smoke, find out from your health care provider how to quit. If you do not use tobacco, do not start.  Lung cancer screening is recommended for adults aged 41-80 years who are at high risk for developing lung cancer because of a history of smoking. A yearly low-dose CT scan of the lungs is recommended for people who have at least a 30-pack-year history of smoking and are a current smoker or have quit within the past 15 years. A pack year of smoking is smoking an average of 1 pack  of cigarettes a day for 1 year (for example: 1 pack a day for 30 years or 2 packs a day for 15 years). Yearly screening should continue until the smoker has stopped smoking for at least 15 years. Yearly screening should be stopped for people who develop a health problem that would prevent them from having lung cancer treatment.  If you are pregnant, do not drink alcohol. If you are breastfeeding, be very cautious about drinking alcohol. If you are not pregnant and choose to drink alcohol, do not have more than 1 drink per day. One drink is considered to be 12 ounces (355 mL) of beer, 5 ounces (148 mL) of wine, or 1.5 ounces (44 mL) of liquor.  Avoid use of street drugs. Do not share needles with anyone. Ask for help if you need support or instructions about stopping the use of drugs.  High blood pressure causes heart disease and increases the risk of stroke. Your blood pressure should be checked at least every 1 to 2 years. Ongoing high blood pressure should be treated with medicines if weight loss and exercise do not work.  If you are 43-37 years old, ask your health care provider if you should take aspirin to prevent strokes.  Diabetes screening is done by taking a blood sample to check your blood glucose level after you have not eaten for a certain period of time (fasting). If you are not overweight and you do not have risk factors for diabetes, you should be screened once every 3 years starting at age 9. If you are overweight or obese and you are 17-74 years of age, you should be screened for diabetes every year as part of your cardiovascular risk assessment.  Breast cancer screening is essential preventive care for women. You should practice "breast self-awareness." This means understanding the normal appearance and feel of your breasts and may include breast self-examination. Any changes detected, no matter how small, should be reported to a health care provider. Women in their 30s and 30s should  have a clinical breast exam (CBE) by a health care provider as part of a regular health exam every 1 to 3 years. After age 73, women should have a CBE every year. Starting at age 78, women should consider having a mammogram (breast X-ray test) every year. Women who have a family history of breast cancer should talk to their health care provider about genetic screening. Women at a high risk of breast cancer should talk to their health care providers about having an MRI and a mammogram every year.  Breast cancer gene (BRCA)-related cancer risk assessment is recommended for women who have family members with BRCA-related cancers. BRCA-related cancers include breast, ovarian, tubal, and peritoneal cancers. Having family members with these cancers may be associated with an increased risk for harmful changes (mutations) in the breast cancer genes BRCA1 and BRCA2. Results of the assessment will determine the need for genetic counseling and BRCA1  and BRCA2 testing.  Your health care provider may recommend that you be screened regularly for cancer of the pelvic organs (ovaries, uterus, and vagina). This screening involves a pelvic examination, including checking for microscopic changes to the surface of your cervix (Pap test). You may be encouraged to have this screening done every 3 years, beginning at age 29.  For women ages 10-65, health care providers may recommend pelvic exams and Pap testing every 3 years, or they may recommend the Pap and pelvic exam, combined with testing for human papilloma virus (HPV), every 5 years. Some types of HPV increase your risk of cervical cancer. Testing for HPV may also be done on women of any age with unclear Pap test results.  Other health care providers may not recommend any screening for nonpregnant women who are considered low risk for pelvic cancer and who do not have symptoms. Ask your health care provider if a screening pelvic exam is right for you.  If you have had  past treatment for cervical cancer or a condition that could lead to cancer, you need Pap tests and screening for cancer for at least 20 years after your treatment. If Pap tests have been discontinued, your risk factors (such as having a new sexual partner) need to be reassessed to determine if screening should resume. Some women have medical problems that increase the chance of getting cervical cancer. In these cases, your health care provider may recommend more frequent screening and Pap tests.  Colorectal cancer can be detected and often prevented. Most routine colorectal cancer screening begins at the age of 19 years and continues through age 53 years. However, your health care provider may recommend screening at an earlier age if you have risk factors for colon cancer. On a yearly basis, your health care provider may provide home test kits to check for hidden blood in the stool. Use of a small camera at the end of a tube, to directly examine the colon (sigmoidoscopy or colonoscopy), can detect the earliest forms of colorectal cancer. Talk to your health care provider about this at age 54, when routine screening begins. Direct exam of the colon should be repeated every 5-10 years through age 69 years, unless early forms of precancerous polyps or small growths are found.  People who are at an increased risk for hepatitis B should be screened for this virus. You are considered at high risk for hepatitis B if:  You were born in a country where hepatitis B occurs often. Talk with your health care provider about which countries are considered high risk.  Your parents were born in a high-risk country and you have not received a shot to protect against hepatitis B (hepatitis B vaccine).  You have HIV or AIDS.  You use needles to inject street drugs.  You live with, or have sex with, someone who has hepatitis B.  You get hemodialysis treatment.  You take certain medicines for conditions like cancer,  organ transplantation, and autoimmune conditions.  Hepatitis C blood testing is recommended for all people born from [birth year] through 1967 and any individual with known risks for hepatitis C.  Practice safe sex. Use condoms and avoid high-risk sexual practices to reduce the spread of sexually transmitted infections (STIs). STIs include gonorrhea, chlamydia, syphilis, trichomonas, herpes, HPV, and human immunodeficiency virus (HIV). Herpes, HIV, and HPV are viral illnesses that have no cure. They can result in disability, cancer, and death.  You should be screened for sexually transmitted illnesses (STIs)  including gonorrhea and chlamydia if:  You are sexually active and are younger than 24 years.  You are older than 24 years and your health care provider tells you that you are at risk for this type of infection.  Your sexual activity has changed since you were last screened and you are at an increased risk for chlamydia or gonorrhea. Ask your health care provider if you are at risk.  If you are at risk of being infected with HIV, it is recommended that you take a prescription medicine daily to prevent HIV infection. This is called preexposure prophylaxis (PrEP). You are considered at risk if:  You are sexually active and do not regularly use condoms or know the HIV status of your partner(s).  You take drugs by injection.  You are sexually active with a partner who has HIV.  Talk with your health care provider about whether you are at high risk of being infected with HIV. If you choose to begin PrEP, you should first be tested for HIV. You should then be tested every 3 months for as long as you are taking PrEP.  Osteoporosis is a disease in which the bones lose minerals and strength with aging. This can result in serious bone fractures or breaks. The risk of osteoporosis can be identified using a bone density scan. Women ages 2 years and over and women at risk for fractures or osteoporosis should  discuss screening with their health care providers. Ask your health care provider whether you should take a calcium supplement or vitamin D to reduce the rate of osteoporosis.  Menopause can be associated with physical symptoms and risks. Hormone replacement therapy is available to decrease symptoms and risks. You should talk to your health care provider about whether hormone replacement therapy is right for you.  Use sunscreen. Apply sunscreen liberally and repeatedly throughout the day. You should seek shade when your shadow is shorter than you. Protect yourself by wearing long sleeves, pants, a wide-brimmed hat, and sunglasses year round, whenever you are outdoors.  Once a month, do a whole body skin exam, using a mirror to look at the skin on your back. Tell your health care provider of new moles, moles that have irregular borders, moles that are larger than a pencil eraser, or moles that have changed in shape or color.  Stay current with required vaccines (immunizations).  Influenza vaccine. All adults should be immunized every year.  Tetanus, diphtheria, and acellular pertussis (Td, Tdap) vaccine. Pregnant women should receive 1 dose of Tdap vaccine during each pregnancy. The dose should be obtained regardless of the length of time since the last dose. Immunization is preferred during the 27th-36th week of gestation. An adult who has not previously received Tdap or who does not know her vaccine status should receive 1 dose of Tdap. This initial dose should be followed by tetanus and diphtheria toxoids (Td) booster doses every 10 years. Adults with an unknown or incomplete history of completing a 3-dose immunization series with Td-containing vaccines should begin or complete a primary immunization series including a Tdap dose. Adults should receive a Td booster every 10 years.  Varicella vaccine. An adult without evidence of immunity to varicella should receive 2 doses or a second dose if she has  previously received 1 dose. Pregnant females who do not have evidence of immunity should receive the first dose after pregnancy. This first dose should be obtained before leaving the health care facility. The second dose should be obtained  4-8 weeks after the first dose.  Human papillomavirus (HPV) vaccine. Females aged 13-26 years who have not received the vaccine previously should obtain the 3-dose series. The vaccine is not recommended for use in pregnant females. However, pregnancy testing is not needed before receiving a dose. If a female is found to be pregnant after receiving a dose, no treatment is needed. In that case, the remaining doses should be delayed until after the pregnancy. Immunization is recommended for any person with an immunocompromised condition through the age of 16 years if she did not get any or all doses earlier. During the 3-dose series, the second dose should be obtained 4-8 weeks after the first dose. The third dose should be obtained 24 weeks after the first dose and 16 weeks after the second dose.  Zoster vaccine. One dose is recommended for adults aged 26 years or older unless certain conditions are present.  Measles, mumps, and rubella (MMR) vaccine. Adults born before [birth year] generally are considered immune to measles and mumps. Adults born in [birth year] or later should have 1 or more doses of MMR vaccine unless there is a contraindication to the vaccine or there is laboratory evidence of immunity to each of the three diseases. A routine second dose of MMR vaccine should be obtained at least 28 days after the first dose for students attending postsecondary schools, health care workers, or international travelers. People who received inactivated measles vaccine or an unknown type of measles vaccine during 1963-1967 should receive 2 doses of MMR vaccine. People who received inactivated mumps vaccine or an unknown type of mumps vaccine before 1979 and are at high risk for mumps  infection should consider immunization with 2 doses of MMR vaccine. For females of childbearing age, rubella immunity should be determined. If there is no evidence of immunity, females who are not pregnant should be vaccinated. If there is no evidence of immunity, females who are pregnant should delay immunization until after pregnancy. Unvaccinated health care workers born before [birth year] who lack laboratory evidence of measles, mumps, or rubella immunity or laboratory confirmation of disease should consider measles and mumps immunization with 2 doses of MMR vaccine or rubella immunization with 1 dose of MMR vaccine.  Pneumococcal 13-valent conjugate (PCV13) vaccine. When indicated, a person who is uncertain of his immunization history and has no record of immunization should receive the PCV13 vaccine. All adults 53 years of age and older should receive this vaccine. An adult aged 68 years or older who has certain medical conditions and has not been previously immunized should receive 1 dose of PCV13 vaccine. This PCV13 should be followed with a dose of pneumococcal polysaccharide (PPSV23) vaccine. Adults who are at high risk for pneumococcal disease should obtain the PPSV23 vaccine at least 8 weeks after the dose of PCV13 vaccine. Adults older than 22 years of age who have normal immune system function should obtain the PPSV23 vaccine dose at least 1 year after the dose of PCV13 vaccine.  Pneumococcal polysaccharide (PPSV23) vaccine. When PCV13 is also indicated, PCV13 should be obtained first. All adults aged 35 years and older should be immunized. An adult younger than age 53 years who has certain medical conditions should be immunized. Any person who resides in a nursing home or long-term care facility should be immunized. An adult smoker should be immunized. People with an immunocompromised condition and certain other conditions should receive both PCV13 and PPSV23 vaccines. People with human immunodeficiency  virus (HIV) infection should be immunized as  soon as possible after diagnosis. Immunization during chemotherapy or radiation therapy should be avoided. Routine use of PPSV23 vaccine is not recommended for American Indians, [place] Natives, or people younger than 65 years unless there are medical conditions that require PPSV23 vaccine. When indicated, people who have unknown immunization and have no record of immunization should receive PPSV23 vaccine. One-time revaccination 5 years after the first dose of PPSV23 is recommended for people aged 19-64 years who have chronic kidney failure, nephrotic syndrome, asplenia, or immunocompromised conditions. People who received 1-2 doses of PPSV23 before age 21 years should receive another dose of PPSV23 vaccine at age 23 years or later if at least 5 years have passed since the previous dose. Doses of PPSV23 are not needed for people immunized with PPSV23 at or after age 16 years.  Meningococcal vaccine. Adults with asplenia or persistent complement component deficiencies should receive 2 doses of quadrivalent meningococcal conjugate (MenACWY-D) vaccine. The doses should be obtained at least 2 months apart. Microbiologists working with certain meningococcal bacteria, [place] recruits, people at risk during an outbreak, and people who travel to or live in countries with a high rate of meningitis should be immunized. A first-year college student up through age 62 years who is living in a residence hall should receive a dose if she did not receive a dose on or after her 16th birthday. Adults who have certain high-risk conditions should receive one or more doses of vaccine.  Hepatitis A vaccine. Adults who wish to be protected from this disease, have certain high-risk conditions, work with hepatitis A-infected animals, work in hepatitis A research labs, or travel to or work in countries with a high rate of hepatitis A should be immunized. Adults who were previously  unvaccinated and who anticipate close contact with an international adoptee during the first 60 days after arrival in the [place] from a country with a high rate of hepatitis A should be immunized.  Hepatitis B vaccine. Adults who wish to be protected from this disease, have certain high-risk conditions, may be exposed to blood or other infectious body fluids, are household contacts or sex partners of hepatitis B positive people, are clients or workers in certain care facilities, or travel to or work in countries with a high rate of hepatitis B should be immunized.  Haemophilus influenzae type b (Hib) vaccine. A previously unvaccinated person with asplenia or sickle cell disease or having a scheduled splenectomy should receive 1 dose of Hib vaccine. 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 Hib vaccine is not recommended for adults with HIV infection. Preventive Services / Frequency Ages 65 to 63 years  Blood pressure check.** / Every 3-5 years.  Lipid and cholesterol check.** / Every 5 years beginning at age 62.  Clinical breast exam.** / Every 3 years for women in their 32s and 62s.  BRCA-related cancer risk assessment.** / For women who have family members with a BRCA-related cancer (breast, ovarian, tubal, or peritoneal cancers).  Pap test.** / Every 2 years from ages 72 through 52. Every 3 years starting at age 32 through age 3 or 44 with a history of 3 consecutive normal Pap tests.  HPV screening.** / Every 3 years from ages 23 through ages 49 to 90 with a history of 3 consecutive normal Pap tests.  Hepatitis C blood test.** / For any individual with known risks for hepatitis C.  Skin self-exam. /  Monthly.  Influenza vaccine. / Every year.  Tetanus, diphtheria, and acellular pertussis (Tdap, Td) vaccine.** / Consult your health care provider. Pregnant women  should receive 1 dose of Tdap vaccine during each pregnancy. 1 dose of Td every 10 years.  Varicella vaccine.** / Consult your health care provider. Pregnant females who do not have evidence of immunity should receive the first dose after pregnancy.  HPV vaccine. / 3 doses over 6 months, if 26 and younger. The vaccine is not recommended for use in pregnant females. However, pregnancy testing is not needed before receiving a dose.  Measles, mumps, rubella (MMR) vaccine.** / You need at least 1 dose of MMR if you were born in 1957 or later. You may also need a 2nd dose. For females of childbearing age, rubella immunity should be determined. If there is no evidence of immunity, females who are not pregnant should be vaccinated. If there is no evidence of immunity, females who are pregnant should delay immunization until after pregnancy.  Pneumococcal 13-valent conjugate (PCV13) vaccine.** / Consult your health care provider.  Pneumococcal polysaccharide (PPSV23) vaccine.** / 1 to 2 doses if you smoke cigarettes or if you have certain conditions.  Meningococcal vaccine.** / 1 dose if you are age 79 to 30 years and a Market researcher living in a residence hall, or have one of several medical conditions, you need to get vaccinated against meningococcal disease. You may also need additional booster doses.  Hepatitis A vaccine.** / Consult your health care provider.  Hepatitis B vaccine.** / Consult your health care provider.  Haemophilus influenzae type b (Hib) vaccine.** / Consult your health care provider. Ages 58 to 36 years  Blood pressure check.** / Every year.  Lipid and cholesterol check.** / Every 5 years beginning at age 66 years.  Lung cancer screening. / Every year if you are aged 57-80 years and have a 30-pack-year history of smoking and currently smoke or have quit within the past 15 years. Yearly screening is stopped once you have quit smoking for at least 15 years or  develop a health problem that would prevent you from having lung cancer treatment.  Clinical breast exam.** / Every year after age 45 years.  BRCA-related cancer risk assessment.** / For women who have family members with a BRCA-related cancer (breast, ovarian, tubal, or peritoneal cancers).  Mammogram.** / Every year beginning at age 58 years and continuing for as long as you are in good health. Consult with your health care provider.  Pap test.** / Every 3 years starting at age 81 years through age 61 or 28 years with a history of 3 consecutive normal Pap tests.  HPV screening.** / Every 3 years from ages 19 years through ages 73 to 76 years with a history of 3 consecutive normal Pap tests.  Fecal occult blood test (FOBT) of stool. / Every year beginning at age 54 years and continuing until age 45 years. You may not need to do this test if you get a colonoscopy every 10 years.  Flexible sigmoidoscopy or colonoscopy.** / Every 5 years for a flexible sigmoidoscopy or every 10 years for a colonoscopy beginning at age 6 years and continuing until age 33 years.  Hepatitis C blood test.** / For all people born from [birth year] through 1967 and any individual with known risks for hepatitis C.  Skin self-exam. / Monthly.  Influenza vaccine. / Every year.  Tetanus, diphtheria, and acellular pertussis (Tdap/Td) vaccine.** / Consult your health care provider. Pregnant  women should receive 1 dose of Tdap vaccine during each pregnancy. 1 dose of Td every 10 years.  Varicella vaccine.** / Consult your health care provider. Pregnant females who do not have evidence of immunity should receive the first dose after pregnancy.  Zoster vaccine.** / 1 dose for adults aged 5 years or older.  Measles, mumps, rubella (MMR) vaccine.** / You need at least 1 dose of MMR if you were born in 1957 or later. You may also need a second dose. For females of childbearing age, rubella immunity should be determined. If there  is no evidence of immunity, females who are not pregnant should be vaccinated. If there is no evidence of immunity, females who are pregnant should delay immunization until after pregnancy.  Pneumococcal 13-valent conjugate (PCV13) vaccine.** / Consult your health care provider.  Pneumococcal polysaccharide (PPSV23) vaccine.** / 1 to 2 doses if you smoke cigarettes or if you have certain conditions.  Meningococcal vaccine.** / Consult your health care provider.  Hepatitis A vaccine.** / Consult your health care provider.  Hepatitis B vaccine.** / Consult your health care provider.  Haemophilus influenzae type b (Hib) vaccine.** / Consult your health care provider. Ages 49 years and over  Blood pressure check.** / Every year.  Lipid and cholesterol check.** / Every 5 years beginning at age 66 years.  Lung cancer screening. / Every year if you are aged 33-80 years and have a 30-pack-year history of smoking and currently smoke or have quit within the past 15 years. Yearly screening is stopped once you have quit smoking for at least 15 years or develop a health problem that would prevent you from having lung cancer treatment.  Clinical breast exam.** / Every year after age 71 years.  BRCA-related cancer risk assessment.** / For women who have family members with a BRCA-related cancer (breast, ovarian, tubal, or peritoneal cancers).  Mammogram.** / Every year beginning at age 6 years and continuing for as long as you are in good health. Consult with your health care provider.  Pap test.** / Every 3 years starting at age 40 years through age 1 or 71 years with 3 consecutive normal Pap tests. Testing can be stopped between 65 and 70 years with 3 consecutive normal Pap tests and no abnormal Pap or HPV tests in the past 10 years.  HPV screening.** / Every 3 years from ages 60 years through ages 54 or 46 years with a history of 3 consecutive normal Pap tests. Testing can be stopped between 65  and 70 years with 3 consecutive normal Pap tests and no abnormal Pap or HPV tests in the past 10 years.  Fecal occult blood test (FOBT) of stool. / Every year beginning at age 65 years and continuing until age 43 years. You may not need to do this test if you get a colonoscopy every 10 years.  Flexible sigmoidoscopy or colonoscopy.** / Every 5 years for a flexible sigmoidoscopy or every 10 years for a colonoscopy beginning at age 76 years and continuing until age 41 years.  Hepatitis C blood test.** / For all people born from [birth year] through 1965 and any individual with known risks for hepatitis C.  Osteoporosis screening.** / A one-time screening for women ages 34 years and over and women at risk for fractures or osteoporosis.  Skin self-exam. / Monthly.  Influenza vaccine. / Every year.  Tetanus, diphtheria, and acellular pertussis (Tdap/Td) vaccine.** / 1 dose of Td every 10 years.  Varicella vaccine.** / Consult your  health care provider.  Zoster vaccine.** / 1 dose for adults aged 64 years or older.  Pneumococcal 13-valent conjugate (PCV13) vaccine.** / Consult your health care provider.  Pneumococcal polysaccharide (PPSV23) vaccine.** / 1 dose for all adults aged 23 years and older.  Meningococcal vaccine.** / Consult your health care provider.  Hepatitis A vaccine.** / Consult your health care provider.  Hepatitis B vaccine.** / Consult your health care provider.  Haemophilus influenzae type b (Hib) vaccine.** / Consult your health care provider. ** Family history and personal history of risk and conditions may change your health care provider's recommendations.   This information is not intended to replace advice given to you by your health care provider. Make sure you discuss any questions you have with your health care provider.   Document Released: 02/14/2001 Document Revised: 01/09/2014 Document Reviewed: 05/16/2010 Elsevier Interactive Patient Education [employer].

## 2015-08-12 LAB — CYTOLOGY - PAP

## 2015-11-03 ENCOUNTER — Encounter: Payer: Self-pay | Admitting: *Deleted

## 2015-11-18 ENCOUNTER — Ambulatory Visit: Payer: Managed Care, Other (non HMO) | Attending: Family Medicine | Admitting: Physical Therapy

## 2015-11-18 ENCOUNTER — Encounter: Payer: Self-pay | Admitting: Physical Therapy

## 2015-11-18 DIAGNOSIS — M6281 Muscle weakness (generalized): Secondary | ICD-10-CM

## 2015-11-18 DIAGNOSIS — R279 Unspecified lack of coordination: Secondary | ICD-10-CM | POA: Diagnosis present

## 2015-11-18 DIAGNOSIS — R252 Cramp and spasm: Secondary | ICD-10-CM

## 2015-11-18 NOTE — Patient Instructions (Signed)
Relaxation Exercises with the Urge to Void   When you experience an urge to void:  FIRST  Stop and stand very still    Sit down if you can    Don't move    You need to stay very still to maintain control  SECOND Squeeze your pelvic floor muscles 5 times, like a quick flick, to keep from leaking  THIRD Relax  Take a deep breath and then let it out  Try to make the urge go away by using relaxation and visualization techniques  FINALLY When you feel the urge go away somewhat, walk normally to the bathroom.   If the urge gets suddenly stronger on the way, you may stop again and relax to regain control.   YouTube: [employer] pelvic floor relaxation meditation

## 2015-11-18 NOTE — Therapy (Signed)
[facility] [address], [address] [place], [place], [address] Phone: [phone]   Fax:  [phone]  Physical Therapy Evaluation  Patient Details  Name: Mary Crawford MRN: [id number] Date of Birth: [birth year] Referring Provider: Tinnie GensPratt, Tanya MD  Encounter Date: 11/18/2015      PT End of Session - 11/18/15 1324    Visit Number 1   Date for PT Re-Evaluation 03/09/16   PT Start Time 1019   PT Stop Time 1059   PT Time Calculation (min) 40 min   Activity Tolerance Patient tolerated treatment well   Behavior During Therapy [facility] for tasks assessed/performed      Past Medical History:  Diagnosis Date  . Abdominal pain, unspecified site   . 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 Depression    history of  . Dyspepsia   . Fatigue   . Goiter   . Headache    Migraines  . Hypothyroidism, acquired, autoimmune   . Nausea with vomiting   . Orthostatic hypotension   . Pain in joint, pelvic region and thigh    right hip pain  . Pre-syncope 03/2008   cardiac referral for pre syncope 03/10  . Thyroiditis, autoimmune   . Thyrotoxicosis with Hashimoto thyroiditis   . Tobacco smoke exposure    positive hx of passive tobacco smoke exposure    Past Surgical History:  Procedure Laterality Date  . LAPAROSCOPY N/A 01/05/2015   Procedure: LAPAROSCOPY DIAGNOSTIC;  Surgeon: Reva Boresanya S Pratt, MD;  Location: [facility];  Service: Gynecology;  Laterality: N/A;  . TONSILLECTOMY AND ADENOIDECTOMY  11/2001  . TONSILLECTOMY AND ADENOIDECTOMY      There were no vitals filed for this visit.       Subjective Assessment - 11/18/15 1021    Subjective Pt reports she has had very bad cramps and has had them since beginning menstruation about 20 years old.  States she was sure it was endometriosis, but had surgery and everything looked normal.   Limitations Standing;Walking;Sitting   Crawford long can you sit comfortably? unable during  cramping   Crawford long can you stand comfortably? unable during cramping   Crawford long can you walk comfortably? unable during cramping   Patient Stated Goals relieve the mentral cramps   Currently in Pain? 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  No   Is the patient reluctant to leave their home because of a fear of falling?  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   Living Environment Private residence     Prior Function   Level of Independence Independent     Cognition   Overall Cognitive Status Within Functional Limits for tasks assessed     Observation/Other Assessments   Focus on Therapeutic Outcomes (FOTO)  25% limitation     Posture/Postural Control   Posture/Postural Control Postural limitations   Postural Limitations Left pelvic obliquity     Strength   Right Hip ABduction 5/5   Right Hip ADduction 3/5   Left Hip ABduction 5/5   Left Hip ADduction 3/5     Palpation   Palpation  comment Lt innominate anterior rotation; pelvic obliquity     Ambulation/Gait   Gait Pattern Within Functional Limits                 Pelvic Floor Special Questions - 11/18/15 0001    Prior Pelvic/Prostate Exam No   Prior Pregnancies No   Currently Sexually Active No   Urinary Leakage No   Urinary urgency No   Urinary frequency feels like always has to pee; once overy hour especially   Fluid intake 4 bottle of water (8 glasses)   Falling out feeling (prolapse) No   Skin Integrity Intact   Perineal Body/Introitus  Elevated   External Palpation ishiocavernosis tenderness Lt>Rt   Prolapse None   Pelvic Floor Internal Exam Pt was informed and consent was given to perform internal exam   Exam Type Vaginal   Palpation ishciocavernosus, trasnverse peroneus, obdurator   Strength weak squeeze, no lift   Strength # of reps 3   Strength # of seconds 5          [facility] Adult PT Treatment/Exercise - 11/18/15 0001      Manual Therapy   Manual Therapy Muscle Energy Technique   Muscle Energy Technique contract - relax for correction of pelvic obliquity - decreased anterior rotatoin of Lt pelvis - 3x20s holds Lt hip ext; abduction/adduction 5x5sec holds; 5x 10 sec holds Rt hip flexion                PT Education - 11/18/15 1118    Education provided Yes   Education Details mindfulness meditation and relaxing the pelvic floor, urinary urgency and not going every time she has the urge   Person(s) Educated Patient   Methods Explanation;Handout   Comprehension Verbalized understanding          PT Short Term Goals - 11/18/15 1403      PT SHORT TERM GOAL #1   Title independent with HEP   Time 4   Period Weeks   Status New     PT SHORT TERM GOAL #2   Title no palpable fascial restrictions of the pelvic floor muscles   Time 4   Period Weeks   Status New     PT SHORT TERM GOAL #3   Title 50% reduction of pain prior to and during menstrual cycle   Time 4   Period  Weeks   Status New     PT SHORT TERM GOAL #4   Title report voiding no more than 1x/hour for normal bladder function   Time 4  Period Weeks   Status New           PT Long Term Goals - 11/18/15 1406      PT LONG TERM GOAL #1   Title 75% reduction of pain prior to and during menstrual cycle   Time 16   Period Weeks   Status New     PT LONG TERM GOAL #2   Title FOTO < or = to 14%   Time 16   Period Weeks   Status New     PT LONG TERM GOAL #3   Title independent with advanced HEP   Time 16   Period Weeks   Status New     PT LONG TERM GOAL #4   Title able to perform all job related tasks pain free   Time 16   Period Weeks   Status New               Plan - 11/18/15 1348    Clinical Impression Statement Patient was seen for low complexity evaluation due to no comorbidities that will impact treatment.  Patient presents with muscle spasming and weakness of the pelvic floor and adductors as well as pelvic obliquity in Lt hip.  Patient is having urinary frequency as well without leakage.  Pt has pain as high as 8/10 impacting her daily activities for about 2 weeks prior to each menstral cycle.  Patient will benefit from skilled PT in order to assist patient with improved muscle/body awareness in order to reduce fascial restrictions effecting the organs in the abdominal cavity.  Pt will benefit from skilled PT for muscle retraining so she can effectively relax and contract pelvic floor when appropriate for reduced stress during functional movements and pain management during her menstral cycle.   Rehab Potential Excellent   PT Frequency 1x / week   PT Duration Other (comment)  16 weeks tapering to every other week   PT Treatment/Interventions Biofeedback;Therapeutic activities;Therapeutic exercise;Neuromuscular re-education;Patient/family education;Manual techniques   PT Next Visit Plan review relaxation techniques, myofascial release to pelvic floor and bladder  diaphragm, stretches for relaxation of pelvic floor, diaphragmatic breathing, assess hip and lumbar AROM/PROM   PT Home Exercise Plan progress as needed   Recommended Other Services none   Consulted and Agree with Plan of Care Patient      Patient will benefit from skilled therapeutic intervention in order to improve the following deficits and impairments:  Decreased activity tolerance, Increased fascial restricitons, Increased muscle spasms, Decreased strength, Postural dysfunction, Decreased coordination, Pain  Visit Diagnosis: Cramp and spasm - Plan: PT plan of care cert/re-cert  Unspecified lack of coordination - Plan: PT plan of care cert/re-cert  Muscle weakness (generalized) - Plan: PT plan of care cert/re-cert     Problem List Patient Active Problem List   Diagnosis Date Noted  . Pelvic pain in female 12/24/2014  . Cerumen impaction 12/01/2014  . Dysmenorrhea 01/14/2014  . Back pain, thoracic 10/06/2013  . Migraine without aura 05/28/2013  . Depression 02/10/2013  . Goiter   . History of Hashimoto thyroiditis   . Hypothyroidism, acquired, autoimmune   . Orthostatic hypotension     Vincente Poli, PT 11/18/2015, 2:17 PM  [facility] [address], [address] [place], [place], [address] Phone: [phone]   Fax:  [phone]  Name: Mary Crawford MRN: [id number] Date of Birth: 1995/05/21

## 2015-11-23 ENCOUNTER — Encounter: Payer: Self-pay | Admitting: Physical Therapy

## 2015-11-23 ENCOUNTER — Ambulatory Visit: Payer: Managed Care, Other (non HMO) | Admitting: Physical Therapy

## 2015-11-23 DIAGNOSIS — M6281 Muscle weakness (generalized): Secondary | ICD-10-CM

## 2015-11-23 DIAGNOSIS — R252 Cramp and spasm: Secondary | ICD-10-CM

## 2015-11-23 DIAGNOSIS — R279 Unspecified lack of coordination: Secondary | ICD-10-CM

## 2015-11-23 NOTE — Patient Instructions (Signed)
About Abdominal Massage  Abdominal massage, also called external colon massage, is a self-treatment circular massage technique that can reduce and eliminate gas and ease constipation. The colon naturally contracts in waves in a clockwise direction starting from inside the right hip, moving up toward the ribs, across the belly, and down inside the left hip.  When you perform circular abdominal massage, you help stimulate your colon's normal wave pattern of movement called peristalsis.  It is most beneficial when done after eating.  Positioning You can practice abdominal massage with oil while lying down, or in the shower with soap.  Some people find that it is just as effective to do the massage through clothing while sitting or standing.  How to Massage Start by placing your finger tips or knuckles on your right side, just inside your hip bone.  . Make small circular movements while you move upward toward your rib cage.   . Once you reach the bottom right side of your rib cage, take your circular movements across to the left side of the bottom of your rib cage.  . Next, move downward until you reach the inside of your left hip bone.  This is the path your feces travel in your colon. . Continue to perform your abdominal massage in this pattern for 10 minutes each day.     You can apply as much pressure as is comfortable in your massage.  Start gently and build pressure as you continue to practice.  Notice any areas of pain as you massage; areas of slight pain may be relieved as you massage, but if you have areas of significant or intense pain, consult with your healthcare provider.  Other Considerations . General physical activity including bending and stretching can have a beneficial massage-like effect on the colon.  Deep breathing can also stimulate the colon because breathing deeply activates the same nervous system that supplies the colon.   . Abdominal massage should always be used in  combination with a bowel-conscious diet that is high in the proper type of fiber for you, fluids (primarily water), and a regular exercise program.  Hip Flexor Stretch    Lying on back near edge of bed, bend one leg, foot flat. Hang other leg over edge, relaxed, thigh resting entirely on bed for __2__ minutes. Repeat _1___ times. Do _1-2Butterfly, Supine _ sessions per day. Advanced Exercise: Bend knee back keeping thigh in contact with bed.    Lie on back, feet together. Lower knees toward floor, put pillows under your kneesPiriformis Stretch, Sitting Copyright  VHI. All rights reserved.  . Hold __120_ seconds. Repeat _1__ times per session. Do _1-2_ sessions per day.    Sit, one ankle on opposite knee, same-side hand on crossed knee. Push down on knee, keeping spine straight. Lean torso forward, with flat back, until tension is felt in hamstrings and gluteals of crossed-leg side. Hold __30_ seconds.  Repeat _3__ times per session. Do _1-2__ sessions per day.   Copyright  VHI. All rights reserved.    http://[email or web address].exer.us/347   Copyright  VHI. All rights reserved.

## 2015-11-23 NOTE — Therapy (Signed)
[facility] [address], [address] [place], [place], [address] Phone: [phone]   Fax:  [phone]  Physical Therapy Treatment  Patient Details  Name: Mary Crawford MRN: [id number] Date of Birth: November 19, 1995 Referring Provider: Tinnie GensPratt, Tanya MD  Encounter Date: 11/23/2015      PT End of Session - 11/23/15 1115    Visit Number 2   Date for PT Re-Evaluation 03/09/16   PT Start Time 1020   PT Stop Time 1102   PT Time Calculation (min) 42 min   Activity Tolerance Patient tolerated treatment well   Behavior During Therapy [facility] for tasks assessed/performed      Past Medical History:  Diagnosis Date  . Abdominal pain, unspecified site   . 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 Depression    history of  . Dyspepsia   . Fatigue   . Goiter   . Headache    Migraines  . Hypothyroidism, acquired, autoimmune   . Nausea with vomiting   . Orthostatic hypotension   . Pain in joint, pelvic region and thigh    right hip pain  . Pre-syncope 03/2008   cardiac referral for pre syncope 03/10  . Thyroiditis, autoimmune   . Thyrotoxicosis with Hashimoto thyroiditis   . Tobacco smoke exposure    positive hx of passive tobacco smoke exposure    Past Surgical History:  Procedure Laterality Date  . LAPAROSCOPY N/A 01/05/2015   Procedure: LAPAROSCOPY DIAGNOSTIC;  Surgeon: Reva Boresanya S Pratt, MD;  Location: [facility];  Service: Gynecology;  Laterality: N/A;  . TONSILLECTOMY AND ADENOIDECTOMY  11/2001  . TONSILLECTOMY AND ADENOIDECTOMY      There were no vitals filed for this visit.      Subjective Assessment - 11/23/15 1025    Subjective Pt reports she did the meditation 1x per day.  States she felt good since previous visit and has not had as much urinary urgency.  Feels like her bladder is much more full when she uses the bathroom now.   Currently in Pain? No/denies                         [facility]  Adult PT Treatment/Exercise - 11/23/15 0001      Exercises   Exercises Knee/Hip     Knee/Hip Exercises: Stretches   Hip Flexor Stretch Right;1 rep;60 seconds  supine off the table   Piriformis Stretch Both;2 reps;30 seconds  supine   Other Knee/Hip Stretches butterfly stretch in supine with pillows under knees - 1 minute     Manual Therapy   Manual Therapy Soft tissue mobilization;Myofascial release   Manual therapy comments facilitation of abdominal contraction with breathing during abdominal massage   Soft tissue mobilization hip flexor, psoas muscles, soft tissue mob using circular massage technique for improved viscerals and intestinal mobility    Myofascial Release bladder diaphragm and thoracic diaphragm myofascial restriction release                PT Education - 11/23/15 1107    Education provided Yes   Education Details abdominal massage, diaphragmatic breathing, stretches added to HEP   Person(s) Educated Patient   Methods Explanation;Tactile cues;Verbal cues;Handout   Comprehension Verbalized understanding          PT Short Term Goals - 11/23/15 1123      PT SHORT TERM GOAL #1   Title independent with HEP   Time 4  Period Weeks   Status On-going           PT Long Term Goals - 11/18/15 1406      PT LONG TERM GOAL #1   Title 75% reduction of pain prior to and during menstrual cycle   Time 16   Period Weeks   Status New     PT LONG TERM GOAL #2   Title FOTO < or = to 14%   Time 16   Period Weeks   Status New     PT LONG TERM GOAL #3   Title independent with advanced HEP   Time 16   Period Weeks   Status New     PT LONG TERM GOAL #4   Title able to perform all job related tasks pain free   Time 16   Period Weeks   Status New               Plan - 11/23/15 1116    Clinical Impression Statement Pt very tender to palpation R hip flexor/psoas.  Pt responded well to myofascial release with improved sorft tissue pliability.  Pt  able to demonstrate improved muscle coordination of abdominals and diaphragm showing improved ribcage excursion and collapse during breath with tactile cues to facilitate and VC for educating pt on the movements.  Pt continues to have muscle tightness and difficulty finding strong core contraction during movements and will need skilled PT to continue to to work on muscle firing and coordination as well as relaxation of muscle spasms   Rehab Potential Excellent   PT Frequency 1x / week   PT Duration Other (comment)   PT Treatment/Interventions Biofeedback;Therapeutic activities;Therapeutic exercise;Neuromuscular re-education;Patient/family education;Manual techniques   PT Next Visit Plan core control during functional movements, internal soft tissue for reducing muscle spasms   PT Home Exercise Plan progress as needed   Consulted and Agree with Plan of Care Patient      Patient will benefit from skilled therapeutic intervention in order to improve the following deficits and impairments:  Decreased activity tolerance, Increased fascial restricitons, Increased muscle spasms, Decreased strength, Postural dysfunction, Decreased coordination, Pain  Visit Diagnosis: Cramp and spasm  Unspecified lack of coordination  Muscle weakness (generalized)     Problem List Patient Active Problem List   Diagnosis Date Noted  . Pelvic pain in female 12/24/2014  . Cerumen impaction 12/01/2014  . Dysmenorrhea 01/14/2014  . Back pain, thoracic 10/06/2013  . Migraine without aura 05/28/2013  . Depression 02/10/2013  . Goiter   . History of Hashimoto thyroiditis   . Hypothyroidism, acquired, autoimmune   . Orthostatic hypotension     Vincente PoliJakki Crosser, PT 11/23/2015, 11:25 AM  [facility] [address], [address] [place], [place], [address] Phone: [phone]   Fax:  [phone]  Name: Mary Crawford MRN: [id number] Date of Birth:  1995/10/20

## 2015-12-01 ENCOUNTER — Encounter: Payer: Self-pay | Admitting: Physical Therapy

## 2015-12-01 ENCOUNTER — Ambulatory Visit: Payer: Managed Care, Other (non HMO) | Admitting: Physical Therapy

## 2015-12-01 DIAGNOSIS — R252 Cramp and spasm: Secondary | ICD-10-CM | POA: Diagnosis not present

## 2015-12-01 DIAGNOSIS — R279 Unspecified lack of coordination: Secondary | ICD-10-CM

## 2015-12-01 DIAGNOSIS — M6281 Muscle weakness (generalized): Secondary | ICD-10-CM

## 2015-12-01 NOTE — Therapy (Signed)
[facility] [address], [address] [place], [place], [address] Phone: [phone]   Fax:  [phone]  Physical Therapy Treatment  Patient Details  Name: Mary Crawford MRN: [id number] Date of Birth: 1996-01-02 Referring Provider: Tinnie GensPratt, Tanya MD  Encounter Date: 12/01/2015      PT End of Session - 12/01/15 1029    Visit Number 3   Date for PT Re-Evaluation 03/09/16   PT Start Time 1018   PT Stop Time 1058   PT Time Calculation (min) 40 min   Activity Tolerance Patient tolerated treatment well   Behavior During Therapy [facility] for tasks assessed/performed      Past Medical History:  Diagnosis Date  . Abdominal pain, unspecified site   . 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 Depression    history of  . Dyspepsia   . Fatigue   . Goiter   . Headache    Migraines  . Hypothyroidism, acquired, autoimmune   . Nausea with vomiting   . Orthostatic hypotension   . Pain in joint, pelvic region and thigh    right hip pain  . Pre-syncope 03/2008   cardiac referral for pre syncope 03/10  . Thyroiditis, autoimmune   . Thyrotoxicosis with Hashimoto thyroiditis   . Tobacco smoke exposure    positive hx of passive tobacco smoke exposure    Past Surgical History:  Procedure Laterality Date  . LAPAROSCOPY N/A 01/05/2015   Procedure: LAPAROSCOPY DIAGNOSTIC;  Surgeon: Reva Boresanya S Pratt, MD;  Location: [facility];  Service: Gynecology;  Laterality: N/A;  . TONSILLECTOMY AND ADENOIDECTOMY  11/2001  . TONSILLECTOMY AND ADENOIDECTOMY      There were no vitals filed for this visit.      Subjective Assessment - 12/01/15 1101    Subjective Reports she has been doing stretches and does not report any issues.  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   Currently in Pain? No/denies                      Pelvic Floor Special Questions - 12/01/15 0001    Pelvic  Floor Internal Exam patient was informed and consent was given for internal mnaual therapy   Exam Type Vaginal           [facility] Adult PT Treatment/Exercise - 12/01/15 0001      Knee/Hip Exercises: Stretches   Other Knee/Hip Stretches child's pose, cat camel - 3 minutes each     Manual Therapy   Manual Therapy Soft tissue mobilization;Myofascial release   Soft tissue mobilization adductors Rt LE   Myofascial Release bulbocavernosus and levator ani                PT Education - 12/01/15 1028    Education provided Yes   Education Details cat camel and childs pose with breathing   Person(s) Educated Patient   Methods Handout;Explanation;Verbal cues   Comprehension Verbalized understanding          PT Short Term Goals - 12/01/15 1713      PT SHORT TERM GOAL #1   Title independent with HEP   Time 4   Period Weeks   Status Achieved     PT SHORT TERM GOAL #2   Title no palpable fascial restrictions of the pelvic floor muscles   Time 4   Period Weeks   Status On-going  PT SHORT TERM GOAL #3   Title 50% reduction of pain prior to and during menstrual cycle   Time 4   Period Weeks   Status On-going     PT SHORT TERM GOAL #4   Title report voiding no more than 1x/hour for normal bladder function   Time 4   Period Weeks   Status Achieved           PT Long Term Goals - 11/18/15 1406      PT LONG TERM GOAL #1   Title 75% reduction of pain prior to and during menstrual cycle   Time 16   Period Weeks   Status New     PT LONG TERM GOAL #2   Title FOTO < or = to 14%   Time 16   Period Weeks   Status New     PT LONG TERM GOAL #3   Title independent with advanced HEP   Time 16   Period Weeks   Status New     PT LONG TERM GOAL #4   Title able to perform all job related tasks pain free   Time 16   Period Weeks   Status New               Plan - 12/01/15 1716    Clinical Impression Statement Pt tender to palpation Rt hip adductors,  bulbospongeosus and levator muscles.  Stayed more superficial with manual due to tenderness.  Had good myofascial release during treatemnt and soft tissue became more pliable.  Unable to detect progress with goals due to patient not having menstral cycle yet.  Pt able to do breathing throughout manual therapy.  Added to HEP stretches.  Pt continues to need skilled PT in order to work on fascial restrictions in pelvic floor and abdominal cavity.   Rehab Potential Excellent   PT Frequency 1x / week   PT Treatment/Interventions Biofeedback;Therapeutic activities;Therapeutic exercise;Neuromuscular re-education;Patient/family education;Manual techniques   PT Next Visit Plan core control during functional movements, internal soft tissue for reducing muscle spasms   PT Home Exercise Plan progress as needed   Consulted and Agree with Plan of Care Patient      Patient will benefit from skilled therapeutic intervention in order to improve the following deficits and impairments:  Decreased activity tolerance, Increased fascial restricitons, Increased muscle spasms, Decreased strength, Postural dysfunction, Decreased coordination, Pain  Visit Diagnosis: Cramp and spasm  Unspecified lack of coordination  Muscle weakness (generalized)     Problem List Patient Active Problem List   Diagnosis Date Noted  . Pelvic pain in female 12/24/2014  . Cerumen impaction 12/01/2014  . Dysmenorrhea 01/14/2014  . Back pain, thoracic 10/06/2013  . Migraine without aura 05/28/2013  . Depression 02/10/2013  . Goiter   . History of Hashimoto thyroiditis   . Hypothyroidism, acquired, autoimmune   . Orthostatic hypotension     Vincente PoliJakki Crosser, PT 12/01/2015, 5:21 PM  [facility] [address], [address] [place], [place], [address] Phone: [phone]   Fax:  [phone]  Name: Mary Crawford MRN: [id number] Date of Birth: [birth year]

## 2015-12-01 NOTE — Patient Instructions (Signed)
BACK: Child's Pose (Sciatica)    Sit in knee-chest position and reach arms forward. Separate knees for comfort. Hold position for _5__ breaths. Repeat _3__ times. Do _2__ times per day.  Copyright  VHI. All rights reserved.  Spinal Mobility (Cat / Camel): Flexion / Extension    Get ON TARGET. Knees on full roller, hands on flat up roller:  1.Cat: Buttocks up, arch spine segmentally, bottom to top: lift chest as head moves back, look up. 2.Camel: Reverse movement. Close eyes, lower head, tuck chin, compress chest and abdomen, round back. Hold __5_ seconds. Repeat _5__ times. Do _2__ sessions per day.  Copyright  VHI. All rights reserved.

## 2015-12-08 ENCOUNTER — Ambulatory Visit: Payer: Managed Care, Other (non HMO) | Attending: Family Medicine | Admitting: Physical Therapy

## 2015-12-08 ENCOUNTER — Encounter: Payer: Self-pay | Admitting: Physical Therapy

## 2015-12-08 DIAGNOSIS — M6281 Muscle weakness (generalized): Secondary | ICD-10-CM | POA: Diagnosis present

## 2015-12-08 DIAGNOSIS — R279 Unspecified lack of coordination: Secondary | ICD-10-CM | POA: Diagnosis present

## 2015-12-08 DIAGNOSIS — R252 Cramp and spasm: Secondary | ICD-10-CM | POA: Diagnosis present

## 2015-12-08 NOTE — Therapy (Signed)
[facility] [address], [address] [place], [place], [address] Phone: [phone]   Fax:  [phone]  Physical Therapy Treatment  Patient Details  Name: Mary Crawford MRN: [id number] Date of Birth: 12-21-1995 Referring Provider: Tinnie GensPratt, Tanya MD  Encounter Date: 12/08/2015      PT End of Session - 12/08/15 1013    Visit Number 4   Date for PT Re-Evaluation 03/09/16   PT Start Time 1015   PT Stop Time 1101   PT Time Calculation (min) 46 min   Activity Tolerance Patient tolerated treatment well   Behavior During Therapy [facility] for tasks assessed/performed      Past Medical History:  Diagnosis Date  . Abdominal pain, unspecified site   . 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 Depression    history of  . Dyspepsia   . Fatigue   . Goiter   . Headache    Migraines  . Hypothyroidism, acquired, autoimmune   . Nausea with vomiting   . Orthostatic hypotension   . Pain in joint, pelvic region and thigh    right hip pain  . Pre-syncope 03/2008   cardiac referral for pre syncope 03/10  . Thyroiditis, autoimmune   . Thyrotoxicosis with Hashimoto thyroiditis   . Tobacco smoke exposure    positive hx of passive tobacco smoke exposure    Past Surgical History:  Procedure Laterality Date  . LAPAROSCOPY N/A 01/05/2015   Procedure: LAPAROSCOPY DIAGNOSTIC;  Surgeon: Reva Boresanya S Pratt, MD;  Location: [facility];  Service: Gynecology;  Laterality: N/A;  . TONSILLECTOMY AND ADENOIDECTOMY  11/2001  . TONSILLECTOMY AND ADENOIDECTOMY      There were no vitals filed for this visit.      Subjective Assessment - 12/08/15 1017    Subjective Reports having some Rt side low back pain.  started around sunday and was worse yesterday, but feeling better today.  Not noticeable that anything is aggravating it.   Currently in Pain? Yes   Pain Score 3    Pain Location Back   Pain Orientation Right;Lower   Pain Descriptors /  Indicators Aching;Discomfort   Pain Type Acute pain   Pain Onset In the past 7 days   Pain Frequency Intermittent   Aggravating Factors  nothing noticeable   Pain Relieving Factors not bath   Multiple Pain Sites No                         [facility] Adult PT Treatment/Exercise - 12/08/15 0001      Knee/Hip Exercises: Stretches   Other Knee/Hip Stretches butterfly stretch in supine with pillows under knees - 1 minute, biofeedback for cues  to get pelvic floor relaxation - down to 3.5 mV with   Other Knee/Hip Stretches SKTC stretch - demonstrates increased pelvic floor tone     Knee/Hip Exercises: Supine   Other Supine Knee/Hip Exercises kegel with 10 sec hold repeat 5 times educated and added to HEP, used biofeedback for cues     Pelvic Floor Biofeedback   Body Parts Pelvic floor   Number of Minutes 10 minutes   Position Supine   Biofeedback Activity Quick contraction;10 second hold     Manual Therapy   Manual Therapy Soft tissue mobilization   Soft tissue mobilization Rt > Lt lumbar paraspinals                PT Education - 12/08/15 1104  Education provided Yes   Education Details educated in performing contract and hold for 10 sec,  10x adding to HEP   Person(s) Educated Patient   Methods Explanation;Verbal cues   Comprehension Verbalized understanding          PT Short Term Goals - 12/08/15 1110      PT SHORT TERM GOAL #1   Title independent with HEP   Time 4   Period Weeks   Status Achieved     PT SHORT TERM GOAL #4   Title report voiding no more than 1x/hour for normal bladder function   Baseline voiding only every 2+ hours with more full bladder when voiding   Time 4   Period Weeks   Status Achieved           PT Long Term Goals - 12/08/15 1111      PT LONG TERM GOAL #1   Title 75% reduction of pain prior to and during menstrual cycle   Time 16   Period Weeks   Status On-going     PT LONG TERM GOAL #2   Title FOTO < or =  to 14%   Time 16   Period Weeks   Status On-going               Plan - 12/08/15 1107    Clinical Impression Statement Pt demonstrates slightly elevated tone at 4.5 mV average during biofeedback assessment.  Was able to decrease to below 3mV with butterfly stretch.  Pt responded well to pelvic floor contractions without increased tone post exercises, Pt had some muscle spasms in lumbar spine which responded well to manual muscle release.  Skilled PT needed to assess pelvic floor coordination with functional movements and cont to lengthen spasming tissue for decreased pain and improved functoin.   Rehab Potential Excellent   PT Frequency 1x / week   PT Duration Other (comment)  will space out treatments as needed   PT Treatment/Interventions Biofeedback;Therapeutic activities;Therapeutic exercise;Neuromuscular re-education;Patient/family education;Manual techniques   PT Next Visit Plan core control during functional movements, internal soft tissue for reducing muscle spasms   PT Home Exercise Plan progress as needed   Consulted and Agree with Plan of Care Patient      Patient will benefit from skilled therapeutic intervention in order to improve the following deficits and impairments:  Decreased activity tolerance, Increased fascial restricitons, Increased muscle spasms, Decreased strength, Postural dysfunction, Decreased coordination, Pain  Visit Diagnosis: Cramp and spasm  Unspecified lack of coordination  Muscle weakness (generalized)     Problem List Patient Active Problem List   Diagnosis Date Noted  . Pelvic pain in female 12/24/2014  . Cerumen impaction 12/01/2014  . Dysmenorrhea 01/14/2014  . Back pain, thoracic 10/06/2013  . Migraine without aura 05/28/2013  . Depression 02/10/2013  . Goiter   . History of Hashimoto thyroiditis   . Hypothyroidism, acquired, autoimmune   . Orthostatic hypotension     Vincente PoliJakki Crosser, PT 12/08/2015, 3:05 PM  [facility] [address], [address] [place], [place], [address] Phone: [phone]   Fax:  [phone]  Name: Mary Crawford MRN: [id number] Date of Birth: June 17, 1995

## 2015-12-15 ENCOUNTER — Encounter: Payer: Self-pay | Admitting: Physical Therapy

## 2015-12-15 ENCOUNTER — Ambulatory Visit: Payer: Managed Care, Other (non HMO) | Admitting: Physical Therapy

## 2015-12-15 DIAGNOSIS — R279 Unspecified lack of coordination: Secondary | ICD-10-CM

## 2015-12-15 DIAGNOSIS — M6281 Muscle weakness (generalized): Secondary | ICD-10-CM

## 2015-12-15 DIAGNOSIS — R252 Cramp and spasm: Secondary | ICD-10-CM | POA: Diagnosis not present

## 2015-12-15 NOTE — Therapy (Signed)
[facility] [address], [place], [place], [address] Phone: [phone]   Fax:  [phone]  Physical Therapy Treatment  Patient Details  Name: Mary Crawford MRN: [id number] Date of Birth: May 24, 1995 Referring Provider: Darron Doom MD  Encounter Date: 12/15/2015      PT End of Session - 12/15/15 1015    Visit Number 5   Date for PT Re-Evaluation 03/09/16   PT Start Time 4268   PT Stop Time 1100   PT Time Calculation (min) 45 min   Activity Tolerance Patient tolerated treatment well   Behavior During Therapy [facility] for tasks assessed/performed      Past Medical History:  Diagnosis Date  . Abdominal pain, unspecified site   . 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 Depression    history of  . Dyspepsia   . Fatigue   . Goiter   . Headache    Migraines  . Hypothyroidism, acquired, autoimmune   . Nausea with vomiting   . Orthostatic hypotension   . Pain in joint, pelvic region and thigh    right hip pain  . Pre-syncope 03/2008   cardiac referral for pre syncope 03/10  . Thyroiditis, autoimmune   . Thyrotoxicosis with Hashimoto thyroiditis   . Tobacco smoke exposure    positive hx of passive tobacco smoke exposure    Past Surgical History:  Procedure Laterality Date  . LAPAROSCOPY N/A 01/05/2015   Procedure: LAPAROSCOPY DIAGNOSTIC;  Surgeon: Donnamae Jude, MD;  Location: [facility];  Service: Gynecology;  Laterality: N/A;  . TONSILLECTOMY AND ADENOIDECTOMY  11/2001  . TONSILLECTOMY AND ADENOIDECTOMY      There were no vitals filed for this visit.                       [facility] Adult PT Treatment/Exercise - 12/15/15 0001      Knee/Hip Exercises: Stretches   Other Knee/Hip Stretches foam rolling to back and glutes, tennis ball glutes and plantar surface     Manual Therapy   Manual Therapy Soft tissue mobilization   Soft tissue mobilization glutes, lumbar and thoracic  paraspinals                PT Education - 12/15/15 1113    Education provided Yes   Education Details foam rolling and tennis ball rolling for self massage, MET as seen in handout   Person(s) Educated Patient   Methods Explanation;Handout;Demonstration   Comprehension Verbalized understanding;Returned demonstration          PT Short Term Goals - 12/08/15 1110      PT SHORT TERM GOAL #1   Title independent with HEP   Time 4   Period Weeks   Status Achieved     PT SHORT TERM GOAL #4   Title report voiding no more than 1x/hour for normal bladder function   Baseline voiding only every 2+ hours with more full bladder when voiding   Time 4   Period Weeks   Status Achieved           PT Long Term Goals - 12/15/15 1310      PT LONG TERM GOAL #1   Title 75% reduction of pain prior to and during menstrual cycle   Baseline have not had cycle   Time 16   Period Weeks   Status On-going     PT LONG TERM GOAL #2   Title FOTO <  or = to 14%   Time 16   Period Weeks   Status On-going     PT LONG TERM GOAL #3   Title independent with advanced HEP   Time 16   Period Weeks   Status On-going     PT LONG TERM GOAL #4   Title able to perform all job related tasks pain free   Time 16   Period Weeks   Status On-going               Plan - 12/15/15 1311    Clinical Impression Statement Pt reports reduced cramping which she is unsure of whether that is from her current birth control or from the PT at this time.  Patient is making progress with reduced muscle spasming.  Skilled PT is needed to continue to work on ability to relax pelvic floor for reduced abdominal irritation during menstral cycle.  Pt is expecting next cycle a tthe end of January.  Pt will perform stretches and self massage techniques and return to PT every other week at this time due to being able to maintain greater relaxation on her own at this time.   Rehab Potential Excellent   PT Frequency 1x  / week   PT Duration Other (comment)  every other week   PT Treatment/Interventions Biofeedback;Therapeutic activities;Therapeutic exercise;Neuromuscular re-education;Patient/family education;Manual techniques   PT Next Visit Plan core control during functional movements, internal soft tissue for reducing muscle spasms   PT Home Exercise Plan progress as needed   Consulted and Agree with Plan of Care Patient      Patient will benefit from skilled therapeutic intervention in order to improve the following deficits and impairments:  Decreased activity tolerance, Increased fascial restricitons, Increased muscle spasms, Decreased strength, Postural dysfunction, Decreased coordination, Pain  Visit Diagnosis: Cramp and spasm  Unspecified lack of coordination  Muscle weakness (generalized)     Problem List Patient Active Problem List   Diagnosis Date Noted  . Pelvic pain in female 12/24/2014  . Cerumen impaction 12/01/2014  . Dysmenorrhea 01/14/2014  . Back pain, thoracic 10/06/2013  . Migraine without aura 05/28/2013  . Depression 02/10/2013  . Goiter   . History of Hashimoto thyroiditis   . Hypothyroidism, acquired, autoimmune   . Orthostatic hypotension     Zannie Cove, PT 12/15/2015, 1:18 PM  [facility] [address] [place], [place], [address] Phone: [phone]   Fax:  [phone]  Name: Mary Crawford MRN: [id number] Date of Birth: 10-05-1995

## 2015-12-15 NOTE — Patient Instructions (Signed)
Pelvic Rotation: Contract / Relax (Supine)    Hands cane or bar against right knee, resist bent leg moving toward head. Press bar behind left leg. Hold __20__ seconds. Relax. Repeat _5___ times per set. Do once per day or every other day.    http://orth.exer.us/277   Copyright  VHI. All rights reserved.

## 2015-12-22 ENCOUNTER — Encounter: Payer: Managed Care, Other (non HMO) | Admitting: Physical Therapy

## 2015-12-30 ENCOUNTER — Ambulatory Visit: Payer: Managed Care, Other (non HMO) | Admitting: Physical Therapy

## 2015-12-30 ENCOUNTER — Encounter: Payer: Self-pay | Admitting: Physical Therapy

## 2015-12-30 DIAGNOSIS — R279 Unspecified lack of coordination: Secondary | ICD-10-CM

## 2015-12-30 DIAGNOSIS — M6281 Muscle weakness (generalized): Secondary | ICD-10-CM

## 2015-12-30 DIAGNOSIS — R252 Cramp and spasm: Secondary | ICD-10-CM

## 2015-12-30 NOTE — Therapy (Signed)
[facility] [address], [address] [place], [place], [address] Phone: [phone]   Fax:  [phone]  Physical Therapy Treatment  Patient Details  Name: Mary Crawford MRN: [id number] Date of Birth: 1995-11-30 Referring Provider: Tinnie GensPratt, Tanya MD  Encounter Date: 12/30/2015      PT End of Session - 12/30/15 1057    Visit Number 6   Date for PT Re-Evaluation 03/09/16   PT Start Time 1015   PT Stop Time 1056   PT Time Calculation (min) 41 min   Activity Tolerance Patient tolerated treatment well   Behavior During Therapy [facility] for tasks assessed/performed      Past Medical History:  Diagnosis Date  . Abdominal pain, unspecified site   . 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 Depression    history of  . Dyspepsia   . Fatigue   . Goiter   . Headache    Migraines  . Hypothyroidism, acquired, autoimmune   . Nausea with vomiting   . Orthostatic hypotension   . Pain in joint, pelvic region and thigh    right hip pain  . Pre-syncope 03/2008   cardiac referral for pre syncope 03/10  . Thyroiditis, autoimmune   . Thyrotoxicosis with Hashimoto thyroiditis   . Tobacco smoke exposure    positive hx of passive tobacco smoke exposure    Past Surgical History:  Procedure Laterality Date  . LAPAROSCOPY N/A 01/05/2015   Procedure: LAPAROSCOPY DIAGNOSTIC;  Surgeon: Reva Boresanya S Pratt, MD;  Location: [facility];  Service: Gynecology;  Laterality: N/A;  . TONSILLECTOMY AND ADENOIDECTOMY  11/2001  . TONSILLECTOMY AND ADENOIDECTOMY      There were no vitals filed for this visit.      Subjective Assessment - 12/30/15 1016    Subjective Pt states having cramping and pain in lower abdomen and tailbone.  States it seems to start when she is getting up and moving   Currently in Pain? Yes   Pain Score 6    Pain Location Abdomen   Pain Orientation Lower   Pain Descriptors / Indicators Cramping   Pain Onset Today   Aggravating Factors  sit to stand   Pain Relieving Factors rest   Effect of Pain on Daily Activities none   Multiple Pain Sites No                         [facility] Adult PT Treatment/Exercise - 12/30/15 0001      Lumbar Exercises: Supine   Bent Knee Raise 20 reps  with core engaged   Dead Bug 20 reps  core engaged - feet starting on mat   Bridge 10 reps;3 seconds  core and pelvic floor contraction   Other Supine Lumbar Exercises march and UE alternating on foam roll - 10 x each way     Lumbar Exercises: Quadruped   Madcat/Old Horse 10 reps  keeping neutral spine contract pelvic floor and abs 5 sec   Single Arm Raise 20 reps     Manual Therapy   Manual Therapy Myofascial release   Myofascial Release bladder diaphragm                PT Education - 12/30/15 1053    Education provided Yes   Education Details HEP and new exercises   Person(s) Educated Patient   Methods Explanation;Verbal cues;Handout   Comprehension Verbalized understanding  PT Short Term Goals - 12/08/15 1110      PT SHORT TERM GOAL #1   Title independent with HEP   Time 4   Period Weeks   Status Achieved     PT SHORT TERM GOAL #4   Title report voiding no more than 1x/hour for normal bladder function   Baseline voiding only every 2+ hours with more full bladder when voiding   Time 4   Period Weeks   Status Achieved           PT Long Term Goals - 12/30/15 1039      PT LONG TERM GOAL #1   Title 75% reduction of pain prior to and during menstrual cycle   Baseline have not had cycle   Time 16   Period Weeks   Status On-going     PT LONG TERM GOAL #3   Title independent with advanced HEP   Time 16   Period Weeks   Status On-going               Plan - 12/30/15 1039    Clinical Impression Statement Pt has difficulty coordinating core and pelvic floor contraction during movements but demonstrates improved core control with cues.  Had decreased  symptoms when cues to contract on sitting to standing.  Pt will benefit from skilled PT to continue to progress strengthening during functional movments and reduce muscle spasming.   Rehab Potential Excellent   PT Frequency Biweekly   PT Duration Other (comment)   PT Treatment/Interventions Biofeedback;Therapeutic activities;Therapeutic exercise;Neuromuscular re-education;Patient/family education;Manual techniques   PT Next Visit Plan core control during functional movements, internal soft tissue for reducing muscle spasms   PT Home Exercise Plan progress as needed   Consulted and Agree with Plan of Care Patient      Patient will benefit from skilled therapeutic intervention in order to improve the following deficits and impairments:  Decreased activity tolerance, Increased fascial restricitons, Increased muscle spasms, Decreased strength, Postural dysfunction, Decreased coordination, Pain  Visit Diagnosis: Cramp and spasm  Unspecified lack of coordination  Muscle weakness (generalized)     Problem List Patient Active Problem List   Diagnosis Date Noted  . Pelvic pain in female 12/24/2014  . Cerumen impaction 12/01/2014  . Dysmenorrhea 01/14/2014  . Back pain, thoracic 10/06/2013  . Migraine without aura 05/28/2013  . Depression 02/10/2013  . Goiter   . History of Hashimoto thyroiditis   . Hypothyroidism, acquired, autoimmune   . Orthostatic hypotension     Vincente PoliJakki Crosser, PT 12/30/2015, 1:47 PM  [facility] [address], [address] [place], [place], [address] Phone: [phone]   Fax:  [phone]  Name: Mary Crawford MRN: [id number] Date of Birth: [birth year]

## 2015-12-30 NOTE — Patient Instructions (Signed)
Small Ball Marching Supine    Knees flexed. Raise one leg a few inches. Hold briefly, return and raise other leg. Keep hips stationary.  Keep pelvic floor elevated and core engaged Do _2__ sets of _10__ repetitions.  Copyright  VHI. All rights reserved.  Bridge    Lie back, legs bent. Engage pelvic floor and elevate, pressing hips up. Keeping ribs in, lengthen lower back. Hold 3 sec, then lower rolling down along spine from top. Repeat __15__ times. Do ____ sessions per day.  http://pm.exer.us/55   Copyright  VHI. All rights reserved.  Functional Quadriceps: Sit to Stand    Sit on edge of chair, feet flat on floor. Exhale and elevate pelvic floor. Stand upright, extending knees fully. Repeat _10___ times per set. Do __2 sets    Bracing With Arm / Leg Raise (Quadruped)    On hands and knees find neutral spine. Tighten pelvic floor and abdominals and hold. Alternating, lift arm to shoulder level and opposite leg to hip level. Repeat 10___ times. Do _1__ times a day.     http://orth.exer.us/735   Copyright  VHI. All rights reserved.

## 2016-01-05 ENCOUNTER — Encounter: Payer: Self-pay | Admitting: Family Medicine

## 2016-01-05 ENCOUNTER — Ambulatory Visit (INDEPENDENT_AMBULATORY_CARE_PROVIDER_SITE_OTHER): Payer: Managed Care, Other (non HMO) | Admitting: Family Medicine

## 2016-01-05 VITALS — BP 108/72 | HR 108 | Temp 98.4°F | Ht 63.75 in | Wt 130.5 lb

## 2016-01-05 DIAGNOSIS — E038 Other specified hypothyroidism: Secondary | ICD-10-CM

## 2016-01-05 DIAGNOSIS — N946 Dysmenorrhea, unspecified: Secondary | ICD-10-CM

## 2016-01-05 DIAGNOSIS — Z23 Encounter for immunization: Secondary | ICD-10-CM | POA: Diagnosis not present

## 2016-01-05 DIAGNOSIS — E049 Nontoxic goiter, unspecified: Secondary | ICD-10-CM

## 2016-01-05 DIAGNOSIS — E063 Autoimmune thyroiditis: Secondary | ICD-10-CM

## 2016-01-05 DIAGNOSIS — Z0000 Encounter for general adult medical examination without abnormal findings: Secondary | ICD-10-CM

## 2016-01-05 DIAGNOSIS — Z114 Encounter for screening for human immunodeficiency virus [HIV]: Secondary | ICD-10-CM | POA: Diagnosis not present

## 2016-01-05 LAB — LIPID PANEL
Cholesterol: 206 mg/dL — ABNORMAL HIGH (ref 0–200)
HDL: 59.1 mg/dL (ref >39.00)
LDL Cholesterol: 127 mg/dL — ABNORMAL HIGH (ref 0–99)
NONHDL: 147.13
Total CHOL/HDL Ratio: 3
Triglycerides: 102 mg/dL (ref 0.0–149.0)
VLDL: 20.4 mg/dL (ref 0.0–40.0)

## 2016-01-05 LAB — CBC WITH DIFFERENTIAL/PLATELET
BASOS PCT: 0.3 % (ref 0.0–3.0)
Basophils Absolute: 0 10*3/uL (ref 0.0–0.1)
EOS PCT: 0.1 % (ref 0.0–5.0)
Eosinophils Absolute: 0 10*3/uL (ref 0.0–0.7)
HCT: 39.9 % (ref 36.0–46.0)
HEMOGLOBIN: 13.9 g/dL (ref 12.0–15.0)
LYMPHS PCT: 31.8 % (ref 12.0–46.0)
Lymphs Abs: 2 10*3/uL (ref 0.7–4.0)
MCHC: 34.8 g/dL (ref 30.0–36.0)
MCV: 86 fl (ref 78.0–100.0)
MONO ABS: 0.4 10*3/uL (ref 0.1–1.0)
MONOS PCT: 6.4 % (ref 3.0–12.0)
Neutro Abs: 4 10*3/uL (ref 1.4–7.7)
Neutrophils Relative %: 61.4 % (ref 43.0–77.0)
Platelets: 285 10*3/uL (ref 150.0–400.0)
RBC: 4.64 Mil/uL (ref 3.87–5.11)
RDW: 13.4 % (ref 11.5–15.5)
WBC: 6.4 10*3/uL (ref 4.0–10.5)

## 2016-01-05 LAB — COMPREHENSIVE METABOLIC PANEL
ALBUMIN: 4.6 g/dL (ref 3.5–5.2)
ALK PHOS: 35 U/L — AB (ref 39–117)
ALT: 8 U/L (ref 0–35)
AST: 14 U/L (ref 0–37)
BUN: 10 mg/dL (ref 6–23)
CALCIUM: 9.6 mg/dL (ref 8.4–10.5)
CHLORIDE: 103 meq/L (ref 96–112)
CO2: 26 mEq/L (ref 19–32)
Creatinine, Ser: 0.67 mg/dL (ref 0.40–1.20)
GFR: 116.73 mL/min (ref >60.00)
Glucose, Bld: 85 mg/dL (ref 70–99)
POTASSIUM: 3.8 meq/L (ref 3.5–5.1)
SODIUM: 137 meq/L (ref 135–145)
TOTAL PROTEIN: 7.2 g/dL (ref 6.0–8.3)
Total Bilirubin: 0.3 mg/dL (ref 0.2–1.2)

## 2016-01-05 LAB — TSH: TSH: 1.06 u[IU]/mL (ref 0.35–4.50)

## 2016-01-05 MED ORDER — LEVOTHYROXINE SODIUM 25 MCG PO TABS: 25.0000 ug | ORAL_TABLET | Freq: Every day | ORAL | 3 refills | Status: DC

## 2016-01-05 NOTE — Progress Notes (Signed)
Pre visit review using our clinic review tool, if applicable. No additional management support is needed unless otherwise documented below in the visit note. 

## 2016-01-05 NOTE — Assessment & Plan Note (Signed)
Due for tsh today No clinical changes  Exam is stable

## 2016-01-05 NOTE — Assessment & Plan Note (Signed)
HIV screen with labs today Pt is at low risk

## 2016-01-05 NOTE — Progress Notes (Signed)
Subjective:    Patient ID: Mary Crawford, female    DOB: 03-09-1995, 21 y.o.   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    Wt Readings from Last 3 Encounters:  01/05/16 130 lb 8 oz (59.2 kg)  08/10/15 130 lb (59 kg)  01/22/15 134 lb (60.8 kg)  walks a lot / uses treadmill  bmi 22.5  HIV screen - is ok with screening   Flu shot- will get today   Pap 8/17 neg Dr Shawnie Pons On OC from Dr Shawnie Pons- Q mo - going pretty well  Has appt next month /also sees urology  Taking pelvic floor therapy for pelvic pain  Had a laparoscopy-that was normal-no endometriosis  Tetanus shot 6/13  No lab work in a while - wants wellness labs today   Hx of hypothyroidism- hs of hashimotos and goiter  Takes levothyroxine 25 mcg currently  Lab Results  Component Value Date   TSH 2.93 12/01/2014   unsure if her goiter has changes  No pain or trouble swallowing    Hx of depression - dong very well  Mood is good   Patient Active Problem List   Diagnosis Date Noted  . Screening for HIV (human immunodeficiency virus) 01/05/2016  . Routine general medical examination at a health care facility 01/05/2016  . Pelvic pain in female 12/24/2014  . Dysmenorrhea 01/14/2014  . Migraine without aura 05/28/2013  . History of depression 02/10/2013  . Goiter   . History of Hashimoto thyroiditis   . Hypothyroidism, acquired, autoimmune   . Orthostatic hypotension    Past Medical History:  Diagnosis Date  . Abdominal pain, unspecified site   . 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 Depression    history of  . Dyspepsia   . Fatigue   . Goiter   . Headache    Migraines  . Hypothyroidism, acquired, autoimmune   . Nausea with vomiting   . Orthostatic hypotension   . Pain in joint, pelvic region and  thigh    right hip pain  . Pre-syncope 03/2008   cardiac referral for pre syncope 03/10  . Thyroiditis, autoimmune   . Thyrotoxicosis with Hashimoto thyroiditis   . Tobacco smoke exposure    positive hx of passive tobacco smoke exposure   Past Surgical History:  Procedure Laterality Date  . LAPAROSCOPY N/A 01/05/2015   Procedure: LAPAROSCOPY DIAGNOSTIC;  Surgeon: Reva Bores, MD;  Location: [facility];  Service: Gynecology;  Laterality: N/A;  . TONSILLECTOMY AND ADENOIDECTOMY  11/2001  . TONSILLECTOMY AND ADENOIDECTOMY     Social History  Substance Use Topics  . Smoking status: Never Smoker  . Smokeless tobacco: Never Used  . Alcohol use 0.0 oz/week     Comment: occassional   Family History  Problem Relation Age of Onset  . Migraines Mother   . Thyroid disease Mother   . Heart disease Maternal Grandmother   . Diabetes Maternal Grandfather   . Diabetes Paternal Grandfather   . Thyroid disease Paternal Grandfather    Allergies  Allergen Reactions  . Aleve [Naproxen] Hives  . Prochlorperazine Anxiety    restlessness  . Prochlorperazine Edisylate Anxiety   Current Outpatient Prescriptions on File Prior to Visit  Medication Sig Dispense Refill  . acetaminophen (  TYLENOL) 325 MG tablet Take 650 mg by mouth every 6 (six) hours as needed for mild pain or headache.    . Levonorgestrel-Ethinyl Estradiol (AMETHIA,CAMRESE) 0.15-0.03 &0.01 MG tablet Take 1 tablet by mouth daily. 1 Package 4   No current facility-administered medications on file prior to visit.     Review of Systems Review of Systems  Constitutional: Negative for fever, appetite change, fatigue and unexpected weight change.  ENT pos for goiter  Eyes: Negative for pain and visual disturbance.  Respiratory: Negative for cough and shortness of breath.   Cardiovascular: Negative for cp or palpitations    Gastrointestinal: Negative for nausea, diarrhea and constipation.  Genitourinary: Negative for urgency and  frequency. pos for painful menses Skin: Negative for pallor or rash   Neurological: Negative for weakness, light-headedness, numbness and headaches.  Hematological: Negative for adenopathy. Does not bruise/bleed easily.  Psychiatric/Behavioral: Negative for dysphoric mood. The patient is not nervous/anxious.  (mood is good)        Objective:   Physical Exam  Constitutional: She appears well-developed and well-nourished. No distress.  Slim and well appearing   HENT:  Head: Normocephalic and atraumatic.  Right Ear: External ear normal.  Left Ear: External ear normal.  Nose: Nose normal.  Mouth/Throat: Oropharynx is clear and moist.  Eyes: Conjunctivae and EOM are normal. Pupils are equal, round, and reactive to light. Right eye exhibits no discharge. Left eye exhibits no discharge. No scleral icterus.  Neck: Normal range of motion. Neck supple. No JVD present. Carotid bruit is not present. Thyromegaly present.  Stable symmetric goiter No tenderness or bruit   Cardiovascular: Normal rate, regular rhythm, normal heart sounds and intact distal pulses.  Exam reveals no gallop.   Rate in low 90s at rest  Pulmonary/Chest: Effort normal and breath sounds normal. No respiratory distress. She has no wheezes. She has no rales.  Abdominal: Soft. Bowel sounds are normal. She exhibits no distension and no mass. There is no tenderness.  Genitourinary:  Genitourinary Comments: Gyn does exam  Musculoskeletal: She exhibits no edema or tenderness.  Lymphadenopathy:    She has no cervical adenopathy.  Neurological: She is alert. She has normal reflexes. No cranial nerve deficit. She exhibits normal muscle tone. Coordination normal.  Skin: Skin is warm and dry. No rash noted. No erythema. No pallor.  Few brown nevi -b9 appearing  Psychiatric: She has a normal mood and affect.          Assessment & Plan:   Problem List Items Addressed This Visit      Endocrine   Hypothyroidism, acquired,  autoimmune    Due for tsh today No clinical changes  Exam is stable       Relevant Medications   levothyroxine (SYNTHROID, LEVOTHROID) 25 MCG tablet   Other Relevant Orders   TSH (Completed)   Goiter    Stable on exam  Has not seen endocrinologist for a while  No symptoms  TSH today  Hx of Hashimotos      Relevant Medications   levothyroxine (SYNTHROID, LEVOTHROID) 25 MCG tablet     Genitourinary   Dysmenorrhea    Ongoing  Neg laparoscopy  In pelvic floor PT from gyn  Also on long phase OC and doing better        Other   Screening for HIV (human immunodeficiency virus)    HIV screen with labs today Pt is at low risk       Relevant Orders   HIV antibody (  with reflex) (Completed)   Routine general medical examination at a health care facility - Primary    Reviewed health habits including diet and exercise and skin cancer prevention Reviewed appropriate screening tests for age  Also reviewed health mt list, fam hx and immunization status , as well as social and family history   See HPI Labs for wellness today Also HIV screen today  Good health habits Mood is also good  Enc exercise  Sees gyn for her gyn care       Relevant Orders   CBC with Differential/Platelet (Completed)   Comprehensive metabolic panel (Completed)   TSH (Completed)   Lipid panel (Completed)    Other Visit Diagnoses    Need for influenza vaccination       Relevant Orders   Flu Vaccine QUAD 36+ mos IM (Completed)

## 2016-01-05 NOTE — Patient Instructions (Signed)
Flu shot today  Take care of yourself  Wellness labs and thyroid lab today  Also HIV screen   Stay active / eat a healthy diet

## 2016-01-05 NOTE — Assessment & Plan Note (Signed)
Stable on exam  Has not seen endocrinologist for a while  No symptoms  TSH today  Hx of Hashimotos

## 2016-01-05 NOTE — Assessment & Plan Note (Signed)
Ongoing  Neg laparoscopy  In pelvic floor PT from gyn  Also on long phase OC and doing better

## 2016-01-05 NOTE — Assessment & Plan Note (Signed)
Reviewed health habits including diet and exercise and skin cancer prevention Reviewed appropriate screening tests for age  Also reviewed health mt list, fam hx and immunization status , as well as social and family history   See HPI Labs for wellness today Also HIV screen today  Good health habits Mood is also good  Enc exercise  Sees gyn for her gyn care

## 2016-01-06 LAB — HIV ANTIBODY (ROUTINE TESTING W REFLEX): HIV: NONREACTIVE

## 2016-01-13 ENCOUNTER — Encounter: Payer: Self-pay | Admitting: Physical Therapy

## 2016-01-13 ENCOUNTER — Ambulatory Visit: Payer: Managed Care, Other (non HMO) | Attending: Family Medicine | Admitting: Physical Therapy

## 2016-01-13 ENCOUNTER — Encounter: Payer: Self-pay | Admitting: *Deleted

## 2016-01-13 DIAGNOSIS — R279 Unspecified lack of coordination: Secondary | ICD-10-CM | POA: Diagnosis present

## 2016-01-13 DIAGNOSIS — R252 Cramp and spasm: Secondary | ICD-10-CM | POA: Insufficient documentation

## 2016-01-13 DIAGNOSIS — M6281 Muscle weakness (generalized): Secondary | ICD-10-CM | POA: Diagnosis present

## 2016-01-13 NOTE — Patient Instructions (Signed)
  Position yourself as shown grabbing onto feet or behind the knees. You should feel a gentle stretch. Breathe in and allow the pelvic floor muscles to relax. Hold 1 min. 2 times per day.  Adductors, Frog Squat    Crouch with elbows inside knees . Gently push knees outward. Hold _30__ seconds.1 Repeat __1_ times per session. Do _2__ sessions per day.  Copyright  VHI. All rights reserved.   BACK: Child's Pose (Sciatica)    Sit in knee-chest position and reach arms forward. Separate knees for comfort. Hold position for _60__ breaths. Repeat _2__ times. Do _2__ times per day.  Copyright  VHI. All rights reserved.   Piriformis Stretch    Lying on back, pull right knee toward opposite shoulder. Hold _30___ seconds. Repeat __2__ times. Do _1___ sessions per day.  http://gt2.exer.us/258   Copyright  VHI. All rights reserved.   Piriformis Stretch, Supine    Lie supine, one ankle crossed onto opposite knee. Holding bottom leg behind knee, gently pull legs toward chest until stretch is felt in buttock of top leg. Hold _30__ seconds. For deeper stretch gently push top knee away from body.  Repeat _2__ times per session. Do _2__ sessions per day.  Copyright  VHI. All rights reserved.    Supine Knee-to-Chest, Unilateral    Lie on back, hands clasped behind one knee. Pull knee in toward chest until a comfortable stretch is felt in lower back and buttocks. Hold 30___ seconds.  Repeat __2_ times per session. Do _1__ sessions per day.  Copyright  VHI. All rights reserved.  Supine With Rotation    Lie on back with one knee drawn toward chest. Slowly bring bent leg across body until stretch is felt in lower back area. Hold _30__ seconds. Repeat to other side. Repeat _2__ times per session. Do __2_ sessions per day.  Copyright  VHI. All rights reserved.  Butterfly, Supine    Lie on back, feet together. Lower knees toward floor. Hold 60___ seconds. Repeat _2__ times per  session. Do _1__ sessions per day.  Copyright  VHI. All rights reserved.   [facility] [address], [address] [place], [place] [address] Phone # [phone] Fax [phone]  

## 2016-01-13 NOTE — Therapy (Signed)
[facility] [address], [place], [place], [address] Phone: [phone]   Fax:  [phone]  Physical Therapy Treatment  Patient Details  Name: Mary Crawford MRN: [id number] Date of Birth: 1995-12-06 Referring Provider: Darron Doom MD  Encounter Date: 01/13/2016      PT End of Session - 01/13/16 1135    Visit Number 7   Date for PT Re-Evaluation 03/09/16   PT Start Time 1104   PT Stop Time 1145   PT Time Calculation (min) 41 min   Activity Tolerance Patient tolerated treatment well   Behavior During Therapy [facility] for tasks assessed/performed      Past Medical History:  Diagnosis Date  . Abdominal pain, unspecified site   . 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 Depression    history of  . Dyspepsia   . Fatigue   . Goiter   . Headache    Migraines  . Hypothyroidism, acquired, autoimmune   . Nausea with vomiting   . Orthostatic hypotension   . Pain in joint, pelvic region and thigh    right hip pain  . Pre-syncope 03/2008   cardiac referral for pre syncope 03/10  . Thyroiditis, autoimmune   . Thyrotoxicosis with Hashimoto thyroiditis   . Tobacco smoke exposure    positive hx of passive tobacco smoke exposure    Past Surgical History:  Procedure Laterality Date  . LAPAROSCOPY N/A 01/05/2015   Procedure: LAPAROSCOPY DIAGNOSTIC;  Surgeon: Donnamae Jude, MD;  Location: [facility];  Service: Gynecology;  Laterality: N/A;  . TONSILLECTOMY AND ADENOIDECTOMY  11/2001  . TONSILLECTOMY AND ADENOIDECTOMY      There were no vitals filed for this visit.      Subjective Assessment - 01/13/16 1136    Subjective States she had very severe cramping Monday night and was awakened by pain and nausea.  States she will get these a couple of times/year.   How long can you sit comfortably? unable during cramping   How long can you stand comfortably? unable during cramping   How long can you walk  comfortably? unable during cramping   Patient Stated Goals relieve the mentral cramps   Currently in Pain? No/denies                         [facility] Adult PT Treatment/Exercise - 01/13/16 0001      Lumbar Exercises: Supine   Ab Set 10 reps  with coordinating breathing     Lumbar Exercises: Quadruped   Madcat/Old Horse 10 reps  breathing with neutral spine, contracting with exhale     Knee/Hip Exercises: Stretches   Other Knee/Hip Stretches pelvic floor stretches in HEP     Knee/Hip Exercises: Supine   Other Supine Knee/Hip Exercises child's pose     Manual Therapy   Manual Therapy Soft tissue mobilization;Myofascial release   Soft tissue mobilization glutes, lumbar and thoracic paraspinals   Myofascial Release bladder and thoracic diaphragm                PT Education - 01/13/16 1305    Education provided Yes   Education Details stretches   Person(s) Educated Patient   Methods Explanation;Demonstration;Handout   Comprehension Verbalized understanding;Returned demonstration          PT Short Term Goals - 01/13/16 1312      PT SHORT TERM GOAL #2   Title no palpable fascial restrictions of  the pelvic floor muscles   Time 4   Period Weeks   Status On-going     PT SHORT TERM GOAL #3   Title 50% reduction of pain prior to and during menstrual cycle   Time 4   Period Weeks   Status On-going           PT Long Term Goals - 01/13/16 1310      PT LONG TERM GOAL #1   Title 75% reduction of pain prior to and during menstrual cycle   Baseline have not had cycle   Time 16   Period Weeks   Status On-going     PT LONG TERM GOAL #3   Title independent with advanced HEP   Time 16   Period Weeks   Status On-going     PT LONG TERM GOAL #4   Title able to perform all job related tasks pain free   Time 16   Period Weeks   Status On-going               Plan - 01/13/16 1224    Clinical Impression Statement Pt pelvis obliquity was  improved and has been successful with doing MET at home.  Pt has fascial adhesion around bladder diaphragme and muscle spasming lumbar Lt > Rt.  Continues to need PT for release of muscle spasms and fascial restrictions in order to prevent severe cramping.   Rehab Potential Excellent   PT Frequency Biweekly   PT Duration Other (comment)   PT Treatment/Interventions Biofeedback;Therapeutic activities;Therapeutic exercise;Neuromuscular re-education;Patient/family education;Manual techniques   PT Next Visit Plan internal soft tissue release, f/u with stretches   PT Home Exercise Plan progress as needed   Consulted and Agree with Plan of Care Patient      Patient will benefit from skilled therapeutic intervention in order to improve the following deficits and impairments:  Decreased activity tolerance, Increased fascial restricitons, Increased muscle spasms, Decreased strength, Postural dysfunction, Decreased coordination, Pain  Visit Diagnosis: Cramp and spasm  Unspecified lack of coordination  Muscle weakness (generalized)     Problem List Patient Active Problem List   Diagnosis Date Noted  . Screening for HIV (human immunodeficiency virus) 01/05/2016  . Routine general medical examination at a health care facility 01/05/2016  . Pelvic pain in female 12/24/2014  . Dysmenorrhea 01/14/2014  . Migraine without aura 05/28/2013  . History of depression 02/10/2013  . Goiter   . History of Hashimoto thyroiditis   . Hypothyroidism, acquired, autoimmune   . Orthostatic hypotension     Zannie Cove, PT 01/13/2016, 1:13 PM  [facility] [address], [place], [place], [address] Phone: [phone]   Fax:  [phone]  Name: Mary Crawford MRN: [id number] Date of Birth: September 16, 1995

## 2016-01-27 ENCOUNTER — Ambulatory Visit: Payer: Managed Care, Other (non HMO) | Admitting: Physical Therapy

## 2016-01-27 DIAGNOSIS — R279 Unspecified lack of coordination: Secondary | ICD-10-CM

## 2016-01-27 DIAGNOSIS — R252 Cramp and spasm: Secondary | ICD-10-CM

## 2016-01-27 DIAGNOSIS — M6281 Muscle weakness (generalized): Secondary | ICD-10-CM

## 2016-01-27 NOTE — Therapy (Signed)
[facility] [address], [address] [place], [place], [address] Phone: [phone]   Fax:  [phone]  Physical Therapy Treatment  Patient Details  Name: Mary Crawford MRN: [id number] Date of Birth: [birth year] Referring Provider: Tinnie Gens MD  Encounter Date: 01/27/2016      PT End of Session - 01/27/16 1145    Visit Number 8   Date for PT Re-Evaluation 03/09/16   PT Start Time 1100   PT Stop Time 1155   PT Time Calculation (min) 55 min   Activity Tolerance Patient tolerated treatment well   Behavior During Therapy [facility] for tasks assessed/performed      Past Medical History:  Diagnosis Date  . Abdominal pain, unspecified site   . 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 Depression    history of  . Dyspepsia   . Fatigue   . Goiter   . Headache    Migraines  . Hypothyroidism, acquired, autoimmune   . Nausea with vomiting   . Orthostatic hypotension   . Pain in joint, pelvic region and thigh    right hip pain  . Pre-syncope 03/2008   cardiac referral for pre syncope 03/10  . Thyroiditis, autoimmune   . Thyrotoxicosis with Hashimoto thyroiditis   . Tobacco smoke exposure    positive hx of passive tobacco smoke exposure    Past Surgical History:  Procedure Laterality Date  . LAPAROSCOPY N/A 01/05/2015   Procedure: LAPAROSCOPY DIAGNOSTIC;  Surgeon: Reva Bores, MD;  Location: [facility];  Service: Gynecology;  Laterality: N/A;  . TONSILLECTOMY AND ADENOIDECTOMY  11/2001  . TONSILLECTOMY AND ADENOIDECTOMY      There were no vitals filed for this visit.      Subjective Assessment - 01/27/16 1104    Subjective States Rt low back has been sore when doing the exercises.  Denies pain but can feel it.  States she got her period two weeks ago and states her cramping was about he same as other times she has had period   Limitations Standing;Walking;Sitting   How long can you sit comfortably? unable  during cramping   How long can you stand comfortably? unable during cramping   How long can you walk comfortably? unable during cramping   Patient Stated Goals relieve the mentral cramps   Currently in Pain? No/denies                         [facility] Adult PT Treatment/Exercise - 01/27/16 0001      Moist Heat Therapy   Number Minutes Moist Heat 15 Minutes   Moist Heat Location Lumbar Spine     Electrical Stimulation   Electrical Stimulation Location Rt QL applied in sitting   Electrical Stimulation Action IFC   Electrical Stimulation Parameters to tolerance   Electrical Stimulation Goals Pain     Manual Therapy   Manual Therapy Soft tissue mobilization;Myofascial release   Soft tissue mobilization obdurator internus, coccygeus,                   PT Short Term Goals - 01/13/16 1312      PT SHORT TERM GOAL #2   Title no palpable fascial restrictions of the pelvic floor muscles   Time 4   Period Weeks   Status On-going     PT SHORT TERM GOAL #3   Title 50% reduction of pain prior to and during menstrual cycle  Time 4   Period Weeks   Status On-going           PT Long Term Goals - 01/27/16 1445      PT LONG TERM GOAL #1   Title 75% reduction of pain prior to and during menstrual cycle   Baseline no change   Time 16   Period Weeks   Status On-going               Plan - 01/27/16 1435    Clinical Impression Statement Pt tolerated all soft tissue release well and muscle tissue was not tender.  Pt having low back pain and some tightness in QL.  Skilled PT to continue to address spasming muscles and pelvic floor control for reduced symptoms and return to full function during cycle.   Rehab Potential Excellent   PT Frequency 1x / week   PT Duration Other (comment)   PT Treatment/Interventions Biofeedback;Therapeutic activities;Therapeutic exercise;Neuromuscular re-education;Patient/family education;Manual techniques   PT Next Visit  Plan soft tissue release, assess response to e-stim, bulging and lifting while sitting on ball   PT Home Exercise Plan progress as needed   Consulted and Agree with Plan of Care Patient      Patient will benefit from skilled therapeutic intervention in order to improve the following deficits and impairments:  Decreased activity tolerance, Increased fascial restricitons, Increased muscle spasms, Decreased strength, Postural dysfunction, Decreased coordination, Pain  Visit Diagnosis: Cramp and spasm  Unspecified lack of coordination  Muscle weakness (generalized)     Problem List Patient Active Problem List   Diagnosis Date Noted  . Screening for HIV (human immunodeficiency virus) 01/05/2016  . Routine general medical examination at a health care facility 01/05/2016  . Pelvic pain in female 12/24/2014  . Dysmenorrhea 01/14/2014  . Migraine without aura 05/28/2013  . History of depression 02/10/2013  . Goiter   . History of Hashimoto thyroiditis   . Hypothyroidism, acquired, autoimmune   . Orthostatic hypotension     Mary Crawford, PT 01/27/2016, 2:56 PM  [facility] [address], [address] [place], [place], [address] Phone: [phone]   Fax:  [phone]  Name: Mary Crawford M Ringle MRN: [id number] Date of Birth: [birth year]

## 2016-02-03 ENCOUNTER — Ambulatory Visit: Payer: Managed Care, Other (non HMO) | Attending: Family Medicine | Admitting: Physical Therapy

## 2016-02-03 DIAGNOSIS — M6281 Muscle weakness (generalized): Secondary | ICD-10-CM | POA: Diagnosis present

## 2016-02-03 DIAGNOSIS — R279 Unspecified lack of coordination: Secondary | ICD-10-CM | POA: Insufficient documentation

## 2016-02-03 DIAGNOSIS — R252 Cramp and spasm: Secondary | ICD-10-CM

## 2016-02-03 NOTE — Therapy (Signed)
[facility] [address], [address] [place], [place], [address] Phone: [phone]   Fax:  [phone]  Physical Therapy Treatment  Patient Details  Name: Mary Crawford MRN: [id number] Date of Birth: Mar 09, 1995 Referring Provider: Tinnie GensPratt, Tanya MD  Encounter Date: 02/03/2016    Past Medical History:  Diagnosis Date  . Abdominal pain, unspecified site   . 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 Depression    history of  . Dyspepsia   . Fatigue   . Goiter   . Headache    Migraines  . Hypothyroidism, acquired, autoimmune   . Nausea with vomiting   . Orthostatic hypotension   . Pain in joint, pelvic region and thigh    right hip pain  . Pre-syncope 03/2008   cardiac referral for pre syncope 03/10  . Thyroiditis, autoimmune   . Thyrotoxicosis with Hashimoto thyroiditis   . Tobacco smoke exposure    positive hx of passive tobacco smoke exposure    Past Surgical History:  Procedure Laterality Date  . LAPAROSCOPY N/A 01/05/2015   Procedure: LAPAROSCOPY DIAGNOSTIC;  Surgeon: Reva Boresanya S Pratt, MD;  Location: [facility];  Service: Gynecology;  Laterality: N/A;  . TONSILLECTOMY AND ADENOIDECTOMY  11/2001  . TONSILLECTOMY AND ADENOIDECTOMY      There were no vitals filed for this visit.      Subjective Assessment - 02/03/16 1143    Subjective States still has a little soreness in Rt low back.  No cramps other than that.   Currently in Pain? No/denies                         [facility] - 02/03/16 0001      Manual Therapy   Manual Therapy Soft tissue mobilization;Myofascial release   Soft tissue mobilization lumbar, QL, multifidis, recturs femoris, hip flexors   Myofascial Release abdominal                  PT Short Term Goals - 01/13/16 1312      PT SHORT TERM GOAL #2   Title no palpable fascial restrictions of the pelvic floor muscles   Time 4   Period  Weeks   Status On-going     PT SHORT TERM GOAL #3   Title 50% reduction of pain prior to and during menstrual cycle   Time 4   Period Weeks   Status On-going           PT Long Term Goals - 01/27/16 1445      PT LONG TERM GOAL #1   Title 75% reduction of pain prior to and during menstrual cycle   Baseline no change   Time 16   Period Weeks   Status On-going               Plan - 02/03/16 1303    Clinical Impression Statement Tenderness and fascial adhesions  hip flexors and rectus abdominis.  Lumbar paraspinals tension Lt>Rt.  Continue to need PT for release of soft tissue and re-assess cramping at the end of the month.   Rehab Potential Excellent   PT Frequency 1x / week   PT Duration Other (comment)   PT Treatment/Interventions Biofeedback;Therapeutic activities;Therapeutic exercise;Neuromuscular re-education;Patient/family education;Manual techniques   PT Next Visit Plan soft tissue release lumbar and abdominal soft tissue and myofascial, bulging and lifting while sitting on ball, stim and heat as needed   PT  Home Exercise Plan progress as needed   Consulted and Agree with Plan of Care Patient      Patient will benefit from skilled therapeutic intervention in order to improve the following deficits and impairments:  Decreased activity tolerance, Increased fascial restricitons, Increased muscle spasms, Decreased strength, Postural dysfunction, Decreased coordination, Pain  Visit Diagnosis: Cramp and spasm  Unspecified lack of coordination  Muscle weakness (generalized)     Problem List Patient Active Problem List   Diagnosis Date Noted  . Screening for HIV (human immunodeficiency virus) 01/05/2016  . Routine general medical examination at a health care facility 01/05/2016  . Pelvic pain in female 12/24/2014  . Dysmenorrhea 01/14/2014  . Migraine without aura 05/28/2013  . History of depression 02/10/2013  . Goiter   . History of Hashimoto thyroiditis    . Hypothyroidism, acquired, autoimmune   . Orthostatic hypotension     Vincente Poli, PT 02/03/2016, 1:06 PM  [facility] [address] [place], [place], [address] Phone: [phone]   Fax:  [phone]  Name: Mary Crawford MRN: [id number] Date of Birth: Aug 15, 1995

## 2016-02-07 ENCOUNTER — Encounter: Payer: Self-pay | Admitting: Family Medicine

## 2016-02-07 ENCOUNTER — Ambulatory Visit (INDEPENDENT_AMBULATORY_CARE_PROVIDER_SITE_OTHER): Payer: Managed Care, Other (non HMO) | Admitting: Family Medicine

## 2016-02-07 VITALS — BP 124/76 | HR 132 | Ht 64.0 in | Wt 134.0 lb

## 2016-02-07 DIAGNOSIS — N946 Dysmenorrhea, unspecified: Secondary | ICD-10-CM | POA: Diagnosis not present

## 2016-02-07 DIAGNOSIS — R102 Pelvic and perineal pain: Secondary | ICD-10-CM | POA: Diagnosis not present

## 2016-02-07 DIAGNOSIS — Z113 Encounter for screening for infections with a predominantly sexual mode of transmission: Secondary | ICD-10-CM

## 2016-02-07 NOTE — Progress Notes (Signed)
   Subjective:    Patient ID: Mary Crawford is a 21 y.o. female presenting with Follow-up  on 02/07/2016  HPI: Has changed to Sprintec, which is better. She is in PT and that is helping. Has seen Urology and testing was negative.  Review of Systems  Constitutional: Negative for chills and fever.  Respiratory: Negative for shortness of breath.   Cardiovascular: Negative for chest pain.  Gastrointestinal: Negative for abdominal pain, nausea and vomiting.  Genitourinary: Negative for dysuria.  Skin: Negative for rash.      Objective:    BP 124/76   Pulse (!) 132   Ht 5\' 4"  (1.626 m)   Wt 134 lb (60.8 kg)   LMP 01/10/2016   BMI 23.00 kg/m  Physical Exam  Constitutional: She is oriented to person, place, and time. She appears well-developed and well-nourished. No distress.  HENT:  Head: Normocephalic and atraumatic.  Eyes: No scleral icterus.  Neck: Neck supple.  Cardiovascular: Normal rate.   Pulmonary/Chest: Effort normal.  Abdominal: Soft.  Neurological: She is alert and oriented to person, place, and time.  Skin: Skin is warm and dry.  Psychiatric: She has a normal mood and affect.        Assessment & Plan:   Problem List Items Addressed This Visit      Unprioritized   Dysmenorrhea - Primary    Patient will continue pelvic floor PT and Seasonique. Check cultures for GC/chlam.      Relevant Orders   Urine cytology ancillary only   Pelvic pain in female      Total face-to-face time with patient: 15 minutes. Over 50% of encounter was spent on counseling and coordination of care. Return in about 6 months (around 08/06/2016).  Mary Crawford 02/07/2016 1:22 PM

## 2016-02-07 NOTE — Progress Notes (Signed)
Pt here to follow up dysmenorrhea. Overall dysmenorrhea is better.  She states she went to urologist and the pelvic floor PT.

## 2016-02-07 NOTE — Assessment & Plan Note (Signed)
Patient will continue pelvic floor PT and Seasonique. Check cultures for GC/chlam.

## 2016-02-07 NOTE — Patient Instructions (Signed)
Dysmenorrhea °Menstrual cramps (dysmenorrhea) are caused by the muscles of the uterus tightening (contracting) during a menstrual period. For some women, this discomfort is merely bothersome. For others, dysmenorrhea can be severe enough to interfere with everyday activities for a few days each month. °Primary dysmenorrhea is menstrual cramps that last a couple of days when you start having menstrual periods or soon after. This often begins after a teenager starts having her period. As a woman gets older or has a baby, the cramps will usually lessen or disappear. Secondary dysmenorrhea begins later in life, lasts longer, and the pain may be stronger than primary dysmenorrhea. The pain may start before the period and last a few days after the period. °What are the causes? °Dysmenorrhea is usually caused by an underlying problem, such as: °· The tissue lining the uterus grows outside of the uterus in other areas of the body (endometriosis). °· The endometrial tissue, which normally lines the uterus, is found in or grows into the muscular walls of the uterus (adenomyosis). °· The pelvic blood vessels are engorged with blood just before the menstrual period (pelvic congestive syndrome). °· Overgrowth of cells (polyps) in the lining of the uterus or cervix. °· Falling down of the uterus (prolapse) because of loose or stretched ligaments. °· Depression. °· Bladder problems, infection, or inflammation. °· Problems with the intestine, a tumor, or irritable bowel syndrome. °· Cancer of the female organs or bladder. °· A severely tipped uterus. °· A very tight opening or closed cervix. °· Noncancerous tumors of the uterus (fibroids). °· Pelvic inflammatory disease (PID). °· Pelvic scarring (adhesions) from a previous surgery. °· Ovarian cyst. °· An intrauterine device (IUD) used for birth control. °What increases the risk? °You may be at greater risk of dysmenorrhea if: °· You are younger than age 30. °· You started puberty  early. °· You have irregular or heavy bleeding. °· You have never given birth. °· You have a family history of this problem. °· You are a smoker. °What are the signs or symptoms? °· Cramping or throbbing pain in your lower abdomen. °· Headaches. °· Lower back pain. °· Nausea or vomiting. °· Diarrhea. °· Sweating or dizziness. °· Loose stools. °How is this diagnosed? °A diagnosis is based on your history, symptoms, physical exam, diagnostic tests, or procedures. Diagnostic tests or procedures may include: °· Blood tests. °· Ultrasonography. °· An examination of the lining of the uterus (dilation and curettage, D&C). °· An examination inside your abdomen or pelvis with a scope (laparoscopy). °· X-rays. °· CT scan. °· MRI. °· An examination inside the bladder with a scope (cystoscopy). °· An examination inside the intestine or stomach with a scope (colonoscopy, gastroscopy). °How is this treated? °Treatment depends on the cause of the dysmenorrhea. Treatment may include: °· Pain medicine prescribed by your health care provider. °· Birth control pills or an IUD with progesterone hormone in it. °· Hormone replacement therapy. °· Nonsteroidal anti-inflammatory drugs (NSAIDs). These may help stop the production of prostaglandins. °· Surgery to remove adhesions, endometriosis, ovarian cyst, or fibroids. °· Removal of the uterus (hysterectomy). °· Progesterone shots to stop the menstrual period. °· Cutting the nerves on the sacrum that go to the female organs (presacral neurectomy). °· Electric current to the sacral nerves (sacral nerve stimulation). °· Antidepressant medicine. °· Psychiatric therapy, counseling, or group therapy. °· Exercise and physical therapy. °· Meditation and yoga therapy. °· Acupuncture. °Follow these instructions at home: °· Only take over-the-counter or prescription medicines as directed   by your health care provider. °· Place a heating pad or hot water bottle on your lower back or abdomen. Do not  sleep with the heating pad. °· Use aerobic exercises, walking, swimming, biking, and other exercises to help lessen the cramping. °· Massage to the lower back or abdomen may help. °· Stop smoking. °· Avoid alcohol and caffeine. °Contact a health care provider if: °· Your pain does not get better with medicine. °· You have pain with sexual intercourse. °· Your pain increases and is not controlled with medicines. °· You have abnormal vaginal bleeding with your period. °· You develop nausea or vomiting with your period that is not controlled with medicine. °Get help right away if: °You pass out. °This information is not intended to replace advice given to you by your health care provider. Make sure you discuss any questions you have with your health care provider. °Document Released: 12/19/2004 Document Revised: 05/27/2015 Document Reviewed: 06/06/2012 °Elsevier Interactive Patient Education © 2017 [employer] ° °

## 2016-02-08 LAB — URINE CYTOLOGY ANCILLARY ONLY
CHLAMYDIA, DNA PROBE: NEGATIVE
NEISSERIA GONORRHEA: NEGATIVE

## 2016-02-10 ENCOUNTER — Ambulatory Visit: Payer: Managed Care, Other (non HMO) | Admitting: Physical Therapy

## 2016-02-10 DIAGNOSIS — R279 Unspecified lack of coordination: Secondary | ICD-10-CM

## 2016-02-10 DIAGNOSIS — R252 Cramp and spasm: Secondary | ICD-10-CM

## 2016-02-10 DIAGNOSIS — M6281 Muscle weakness (generalized): Secondary | ICD-10-CM

## 2016-02-10 NOTE — Therapy (Signed)
[facility] [address], [address] [place], [place], [address] Phone: [phone]   Fax:  [phone]  Physical Therapy Treatment  Patient Details  Name: Mary Crawford MRN: [id number] Date of Birth: [birth year] Referring Provider: Tinnie Gens MD  Encounter Date: 02/10/2016      PT End of Session - 02/10/16 1738    Visit Number 9   Date for PT Re-Evaluation 03/09/16   PT Start Time 1103   PT Stop Time 1152   PT Time Calculation (min) 49 min   Activity Tolerance Patient tolerated treatment well   Behavior During Therapy [facility] for tasks assessed/performed      Past Medical History:  Diagnosis Date  . Abdominal pain, unspecified site   . 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 Depression    history of  . Dyspepsia   . Fatigue   . Goiter   . Headache    Migraines  . Hypothyroidism, acquired, autoimmune   . Nausea with vomiting   . Orthostatic hypotension   . Pain in joint, pelvic region and thigh    right hip pain  . Pre-syncope 03/2008   cardiac referral for pre syncope 03/10  . Thyroiditis, autoimmune   . Thyrotoxicosis with Hashimoto thyroiditis   . Tobacco smoke exposure    positive hx of passive tobacco smoke exposure    Past Surgical History:  Procedure Laterality Date  . LAPAROSCOPY N/A 01/05/2015   Procedure: LAPAROSCOPY DIAGNOSTIC;  Surgeon: Reva Bores, MD;  Location: [facility];  Service: Gynecology;  Laterality: N/A;  . TONSILLECTOMY AND ADENOIDECTOMY  11/2001  . TONSILLECTOMY AND ADENOIDECTOMY      There were no vitals filed for this visit.      Subjective Assessment - 02/10/16 1741    Subjective Reports some mild cramping that is "annoying comes and goes" in abdomen, but much better than normal for one week away from period.  States low back is very sore today.   Patient Stated Goals relieve the mentral cramps   Currently in Pain? Yes   Pain Score 6    Pain Location Back   Pain Orientation Right;Lower   Pain Descriptors / Indicators Discomfort   Pain Type Acute pain   Pain Onset In the past 7 days   Pain Frequency Intermittent   Aggravating Factors  standing and sitting   Pain Relieving Factors rest   Effect of Pain on Daily Activities none   Multiple Pain Sites No                         [facility] Adult PT Treatment/Exercise - 02/10/16 0001      Moist Heat Therapy   Number Minutes Moist Heat 15 Minutes   Moist Heat Location Lumbar Spine  abdomen     Electrical Stimulation   Electrical Stimulation Location Rt QL applied in sitting   Electrical Stimulation Action IFC   Electrical Stimulation Parameters to tolerance x 15 min   Electrical Stimulation Goals Pain     Manual Therapy   Manual Therapy Soft tissue mobilization;Myofascial release   Soft tissue mobilization lumbar, QL, multifidis, recturs femoris, hip flexors   Myofascial Release abdominal                  PT Short Term Goals - 02/10/16 1740      PT SHORT TERM GOAL #3   Title 50% reduction of pain prior to and during  menstrual cycle   Baseline less cramping 1 week out from period   Time 4   Period Weeks   Status On-going           PT Long Term Goals - 01/27/16 1445      PT LONG TERM GOAL #1   Title 75% reduction of pain prior to and during menstrual cycle   Baseline no change   Time 16   Period Weeks   Status On-going               Plan - 02/10/16 1743    Clinical Impression Statement Had no pain after treatment today.  Pt to continue stretching and QL stretch with side bending child's pose.  Skilled PT needed to relax spasming muscles for comformt during menstrul cycle.   Rehab Potential Excellent   PT Frequency 1x / week   PT Duration Other (comment)   PT Treatment/Interventions Biofeedback;Therapeutic activities;Therapeutic exercise;Neuromuscular re-education;Patient/family education;Manual techniques   PT Next Visit Plan re-assess  response to manual and continue STM the week after next   PT Home Exercise Plan progress as needed   Consulted and Agree with Plan of Care Patient      Patient will benefit from skilled therapeutic intervention in order to improve the following deficits and impairments:  Decreased activity tolerance, Increased fascial restricitons, Increased muscle spasms, Decreased strength, Postural dysfunction, Decreased coordination, Pain  Visit Diagnosis: Cramp and spasm  Unspecified lack of coordination  Muscle weakness (generalized)     Problem List Patient Active Problem List   Diagnosis Date Noted  . Screening for HIV (human immunodeficiency virus) 01/05/2016  . Routine general medical examination at a health care facility 01/05/2016  . Pelvic pain in female 12/24/2014  . Dysmenorrhea 01/14/2014  . Migraine without aura 05/28/2013  . History of depression 02/10/2013  . Goiter   . History of Hashimoto thyroiditis   . Hypothyroidism, acquired, autoimmune   . Orthostatic hypotension     Vincente PoliJakki Crosser, PT 02/10/2016, 5:45 PM  [facility] [address], [address] [place], [place], [address] Phone: [phone]   Fax:  [phone]  Name: Mary Crawford MRN: [id number] Date of Birth: [birth year]

## 2016-02-24 ENCOUNTER — Encounter: Payer: Self-pay | Admitting: Physical Therapy

## 2016-02-24 ENCOUNTER — Ambulatory Visit: Payer: Managed Care, Other (non HMO) | Admitting: Physical Therapy

## 2016-02-24 DIAGNOSIS — R279 Unspecified lack of coordination: Secondary | ICD-10-CM

## 2016-02-24 DIAGNOSIS — R252 Cramp and spasm: Secondary | ICD-10-CM

## 2016-02-24 DIAGNOSIS — M6281 Muscle weakness (generalized): Secondary | ICD-10-CM

## 2016-02-24 NOTE — Therapy (Signed)
[facility] [address] [place], [place], [address] Phone: [phone]   Fax:  [phone]  Physical Therapy Treatment  Patient Details  Name: Mary Crawford MRN: [id number] Date of Birth: 1995-03-15 Referring Provider: Darron Doom MD  Encounter Date: 02/24/2016      PT End of Session - 02/24/16 1113    Visit Number 10   Date for PT Re-Evaluation 03/09/16   PT Start Time 1106   PT Stop Time 1133   PT Time Calculation (min) 27 min   Activity Tolerance Patient tolerated treatment well   Behavior During Therapy [facility] for tasks assessed/performed      Past Medical History:  Diagnosis Date  . Abdominal pain, unspecified site   . 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 Depression    history of  . Dyspepsia   . Fatigue   . Goiter   . Headache    Migraines  . Hypothyroidism, acquired, autoimmune   . Nausea with vomiting   . Orthostatic hypotension   . Pain in joint, pelvic region and thigh    right hip pain  . Pre-syncope 03/2008   cardiac referral for pre syncope 03/10  . Thyroiditis, autoimmune   . Thyrotoxicosis with Hashimoto thyroiditis   . Tobacco smoke exposure    positive hx of passive tobacco smoke exposure    Past Surgical History:  Procedure Laterality Date  . LAPAROSCOPY N/A 01/05/2015   Procedure: LAPAROSCOPY DIAGNOSTIC;  Surgeon: Donnamae Jude, MD;  Location: [facility];  Service: Gynecology;  Laterality: N/A;  . TONSILLECTOMY AND ADENOIDECTOMY  11/2001  . TONSILLECTOMY AND ADENOIDECTOMY      There were no vitals filed for this visit.      Subjective Assessment - 02/24/16 1137    Subjective Had menstral cycle with at least 50% less pain and cramps were managed with breathing. Patient was sick so was not able to do the stretches, but states stretches usually help even more.  States she has no more low back pain today.   Patient Stated Goals relieve the mentral cramps    Currently in Pain? No/denies                         [facility] Adult PT Treatment/Exercise - 02/24/16 0001      Lumbar Exercises: Quadruped   Madcat/Old Horse 10 reps  breathing with neutral spine, contracting with exhale     Knee/Hip Exercises: Stretches   Other Knee/Hip Stretches happy baby and child's pose with breathing   Other Knee/Hip Stretches priformis stretch in supine 3x30sec     Manual Therapy   Soft tissue mobilization lumbar, QL, multifidis, recturs femoris, hip flexors   Myofascial Release ribcage depression and elevation                  PT Short Term Goals - 02/24/16 1108      PT SHORT TERM GOAL #1   Title independent with HEP   Time 4   Period Weeks   Status Achieved     PT SHORT TERM GOAL #2   Title no palpable fascial restrictions of the pelvic floor muscles   Time 4   Period Weeks   Status Achieved     PT SHORT TERM GOAL #3   Title 50% reduction of pain prior to and during menstrual cycle   Time 4   Period Weeks   Status On-going  PT SHORT TERM GOAL #4   Title report voiding no more than 1x/hour for normal bladder function   Time 4   Period Weeks   Status Achieved           PT Long Term Goals - 02/24/16 1109      PT LONG TERM GOAL #1   Title 75% reduction of pain prior to and during menstrual cycle   Baseline 50% decreased   Time 16   Period Weeks   Status Not Met     PT LONG TERM GOAL #2   Title FOTO < or = to 14%   Baseline 13%   Time 16   Period Weeks   Status Achieved     PT LONG TERM GOAL #3   Title independent with advanced HEP   Time 16   Period Weeks   Status Achieved     PT LONG TERM GOAL #4   Title able to perform all job related tasks pain free   Baseline still getting some cramping during cycle, but manageable   Time 16   Period Weeks   Status Not Met               Plan - 02/24/16 1135    Clinical Impression Statement Patient has minimal tension in lumbar and thoracic  region and is able to maintain with stretches and relaxation techniques.  Had greatly improved with reduced symptoms and patient is independent with stretches and breathing techniques.  Patient is recommended to discharge with HEP at this time   PT Treatment/Interventions Biofeedback;Therapeutic activities;Therapeutic exercise;Neuromuscular re-education;Patient/family education;Manual techniques   PT Next Visit Plan discharge with HEP   Consulted and Agree with Plan of Care Patient      Patient will benefit from skilled therapeutic intervention in order to improve the following deficits and impairments:  Pain  Visit Diagnosis: Cramp and spasm  Unspecified lack of coordination  Muscle weakness (generalized)     Problem List Patient Active Problem List   Diagnosis Date Noted  . Screening for HIV (human immunodeficiency virus) 01/05/2016  . Routine general medical examination at a health care facility 01/05/2016  . Pelvic pain in female 12/24/2014  . Dysmenorrhea 01/14/2014  . Migraine without aura 05/28/2013  . History of depression 02/10/2013  . Goiter   . History of Hashimoto thyroiditis   . Hypothyroidism, acquired, autoimmune   . Orthostatic hypotension     Zannie Cove 02/24/2016, 11:45 AM  [facility] [address], [place], [place], [address] Phone: [phone]   Fax:  [phone]  Name: Mary Crawford MRN: [id number] Date of Birth: 1996/01/01  PHYSICAL THERAPY DISCHARGE SUMMARY  Visits from Start of Care: 10  Current functional level related to goals / functional outcomes: See goal status above   Remaining deficits: See above detail   Education / Equipment: HEP  Plan: Patient agrees to discharge.  Patient goals were partially met. Patient is being discharged due to being pleased with the current functional level.  ?????         Google, PT 02/24/16 11:47 AM

## 2016-04-19 ENCOUNTER — Ambulatory Visit (INDEPENDENT_AMBULATORY_CARE_PROVIDER_SITE_OTHER): Payer: Managed Care, Other (non HMO) | Admitting: Family Medicine

## 2016-04-19 ENCOUNTER — Encounter: Payer: Self-pay | Admitting: Family Medicine

## 2016-04-19 VITALS — BP 104/66 | HR 75 | Temp 98.5°F | Ht 63.75 in | Wt 128.8 lb

## 2016-04-19 DIAGNOSIS — Z021 Encounter for pre-employment examination: Secondary | ICD-10-CM

## 2016-04-19 DIAGNOSIS — Z111 Encounter for screening for respiratory tuberculosis: Secondary | ICD-10-CM

## 2016-04-19 DIAGNOSIS — F411 Generalized anxiety disorder: Secondary | ICD-10-CM | POA: Diagnosis not present

## 2016-04-19 MED ORDER — SERTRALINE HCL 50 MG PO TABS: 50.0000 mg | ORAL_TABLET | Freq: Every day | ORAL | 11 refills | Status: DC

## 2016-04-19 NOTE — Patient Instructions (Addendum)
Follow up on Friday at 4:45 to have your TB test read   Start back on zoloft 25 mg (1/2 of a 50 mg) -then increase to 50 mg daily after 2 weeks Alert [place] and stop it if any intolerable side effects  Early on it can make you feel spacey or nauseated-this usually passes quickly  If you want to see a counselor in the future let us know  Continue yoga and walking and get outdoors when you can   We will get your paperwork back to you on Friday

## 2016-04-19 NOTE — Progress Notes (Signed)
Pre visit review using our clinic review tool, if applicable. No additional management support is needed unless otherwise documented below in the visit note. 

## 2016-04-19 NOTE — Progress Notes (Signed)
Subjective:    Patient ID: Mary Crawford, female    DOB: Apr 30, 1995, 21 y.o.   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 Screening for HIV (human immunodeficiency virus) 01/05/2016  . Routine general medical examination at a health care facility 01/05/2016  . Pelvic pain in female 12/24/2014  . Dysmenorrhea 01/14/2014  . Migraine without aura 05/28/2013  . History of depression 02/10/2013  . Goiter   . History of Hashimoto thyroiditis   . Hypothyroidism, acquired, autoimmune   . Orthostatic hypotension    Past Medical History:  Diagnosis Date  . Abdominal pain, unspecified site   . 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 Depression    history of  . Dyspepsia   . Fatigue   . Goiter   . Headache    Migraines  . Hypothyroidism, acquired, autoimmune   . Nausea with vomiting   . Orthostatic hypotension   . Pain  in joint, pelvic region and thigh    right hip pain  . Pre-syncope 03/2008   cardiac referral for pre syncope 03/10  . Thyroiditis, autoimmune   . Thyrotoxicosis with Hashimoto thyroiditis   . Tobacco smoke exposure    positive hx of passive tobacco smoke exposure   Past Surgical History:  Procedure Laterality Date  . LAPAROSCOPY N/A 01/05/2015   Procedure: LAPAROSCOPY DIAGNOSTIC;  Surgeon: Reva Bores, MD;  Location: [facility];  Service: Gynecology;  Laterality: N/A;  . TONSILLECTOMY AND ADENOIDECTOMY  11/2001  . TONSILLECTOMY AND ADENOIDECTOMY     Social History  Substance Use Topics  . Smoking status: Never Smoker  . Smokeless tobacco: Never Used  . Alcohol use 0.0 oz/week     Comment: occassional   Family History  Problem Relation Age of Onset  . Migraines Mother   . Thyroid disease Mother   . Heart disease Maternal Grandmother   . Diabetes Maternal Grandfather   . Diabetes Paternal Grandfather   . Thyroid disease Paternal Grandfather    Allergies  Allergen Reactions  . Aleve [Naproxen] Hives  . Prochlorperazine Anxiety    restlessness  . Prochlorperazine Edisylate Anxiety  Current Outpatient Prescriptions on File Prior to Visit  Medication Sig Dispense Refill  . acetaminophen (TYLENOL) 325 MG tablet Take 650 mg by mouth every 6 (six) hours as needed for mild pain or headache.    . Levonorgestrel-Ethinyl Estradiol (AMETHIA,CAMRESE) 0.15-0.03 &0.01 MG tablet Take 1 tablet by mouth daily. 1 Package 4  . levothyroxine (SYNTHROID, LEVOTHROID) 25 MCG tablet Take 1 tablet (25 mcg total) by mouth daily. 90 tablet 3   No current facility-administered medications on file prior to visit.      Review of Systems Review of Systems  Constitutional: Negative for fever, appetite change, fatigue and unexpected weight change.  Eyes: Negative for pain and visual disturbance.  Respiratory: Negative for cough and shortness of breath.   Cardiovascular: Negative for cp or palpitations     Gastrointestinal: Negative for nausea, diarrhea and constipation.  Genitourinary: Negative for urgency and frequency.  Skin: Negative for pallor or rash   Neurological: Negative for weakness, light-headedness, numbness and headaches.  Hematological: Negative for adenopathy. Does not bruise/bleed easily.  Psychiatric/Behavioral: Negative for dysphoric mood. Pos for anxious mood neg for SI       Objective:   Physical Exam  Constitutional: She appears well-developed and well-nourished. No distress.  Slim and well appearing   HENT:  Head: Normocephalic and atraumatic.  Mouth/Throat: Oropharynx is clear and moist.  Eyes: Conjunctivae and EOM are normal. Pupils are equal, round, and reactive to light.  Neck: Normal range of motion. Neck supple. No JVD present. Carotid bruit is not present. No thyromegaly present.  Cardiovascular: Normal rate, regular rhythm, normal heart sounds and intact distal pulses.  Exam reveals no gallop.   Pulmonary/Chest: Effort normal and breath sounds normal. No respiratory distress. She has no wheezes. She has no rales.  No crackles  Abdominal: Soft. Bowel sounds are normal. She exhibits no distension, no abdominal bruit and no mass. There is no tenderness.  Musculoskeletal: She exhibits no edema.  Lymphadenopathy:    She has no cervical adenopathy.  Neurological: She is alert. She has normal reflexes. She displays no tremor. No cranial nerve deficit. She exhibits normal muscle tone. Coordination normal.  Skin: Skin is warm and dry. No rash noted.  Psychiatric: Her speech is normal and behavior is normal. Thought content normal. Her mood appears anxious. Her affect is not blunt and not labile. Thought content is not paranoid. She does not exhibit a depressed mood. She expresses no homicidal and no suicidal ideation.  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  She does exercise Discussed expectations of SSRI medication including time to effectiveness and mechanism of action, also poss of side effects (early and late)- including mental fuzziness, weight or appetite change, nausea and poss of worse dep or anxiety (even suicidal thoughts)  Pt voiced understanding and will stop med and update if this occurs   >25 minutes spent in face to face time with patient, >50% spent in counselling or coordination of care       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

## 2016-04-20 DIAGNOSIS — F418 Other specified anxiety disorders: Secondary | ICD-10-CM | POA: Insufficient documentation

## 2016-04-20 DIAGNOSIS — Z021 Encounter for pre-employment examination: Secondary | ICD-10-CM | POA: Insufficient documentation

## 2016-04-20 NOTE — Assessment & Plan Note (Signed)
Chronic but recently worsened with inc stress levels  Will re start zoloft 25- titrate to 50 as it has worked well for her in the past Declines counseling right now  Reviewed stressors/ coping techniques/symptoms/ support sources/ tx options and side effects in detail today Disc coping tech  She does exercise Discussed expectations of SSRI medication including time to effectiveness and mechanism of action, also poss of side effects (early and late)- including mental fuzziness, weight or appetite change, nausea and poss of worse dep or anxiety (even suicidal thoughts)  Pt voiced understanding and will stop med and update if this occurs   >25 minutes spent in face to face time with patient, >50% spent in counselling or coordination of care

## 2016-04-20 NOTE — Assessment & Plan Note (Signed)
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

## 2016-04-21 LAB — TB SKIN TEST
INDURATION: 0 mm
TB SKIN TEST: NEGATIVE

## 2016-08-11 NOTE — Progress Notes (Signed)
Follow up Pelvic pain - was doing pelvic flood PT and on Seasonique OCP

## 2016-08-15 ENCOUNTER — Ambulatory Visit (INDEPENDENT_AMBULATORY_CARE_PROVIDER_SITE_OTHER): Payer: Managed Care, Other (non HMO) | Admitting: Family Medicine

## 2016-08-15 ENCOUNTER — Encounter: Payer: Self-pay | Admitting: Family Medicine

## 2016-08-15 VITALS — BP 137/79 | HR 90 | Wt 138.0 lb

## 2016-08-15 DIAGNOSIS — N946 Dysmenorrhea, unspecified: Secondary | ICD-10-CM

## 2016-08-15 DIAGNOSIS — R102 Pelvic and perineal pain: Secondary | ICD-10-CM | POA: Diagnosis not present

## 2016-08-15 MED ORDER — LEVONORGEST-ETH ESTRAD 91-DAY 0.15-0.03 &0.01 MG PO TABS: 1.0000 | ORAL_TABLET | Freq: Every day | ORAL | 5 refills | Status: DC

## 2016-08-15 NOTE — Assessment & Plan Note (Signed)
Continue pelvic PT exercises

## 2016-08-15 NOTE — Patient Instructions (Signed)
Pelvic Pain, Female °Pelvic pain is pain in your lower belly (abdomen), below your belly button and between your hips. The pain may start suddenly (acute), keep coming back (recurring), or last a long time (chronic). Pelvic pain that lasts longer than six months is considered chronic. There are many causes of pelvic pain. Sometimes the cause of your pelvic pain is not known. °Follow these instructions at home: °· Take over-the-counter and prescription medicines only as told by your doctor. °· Rest as told by your doctor. °· Do not have sex it if hurts. °· Keep a journal of your pelvic pain. Write down: °? When the pain started. °? Where the pain is located. °? What seems to make the pain better or worse, such as food or your menstrual cycle. °? Any symptoms you have along with the pain. °· Keep all follow-up visits as told by your doctor. This is important. °Contact a doctor if: °· Medicine does not help your pain. °· Your pain comes back. °· You have new symptoms. °· You have unusual vaginal discharge or bleeding. °· You have a fever or chills. °· You are having a hard time pooping (constipation). °· You have blood in your pee (urine) or poop (stool). °· Your pee smells bad. °· You feel weak or lightheaded. °Get help right away if: °· You have sudden pain that is very bad. °· Your pain continues to get worse. °· You have very bad pain and also have any of the following symptoms: °? A fever. °? Feeling stick to your stomach (nausea). °? Throwing up (vomiting). °? Being very sweaty. °· You pass out (lose consciousness). °This information is not intended to replace advice given to you by your health care provider. Make sure you discuss any questions you have with your health care provider. °Document Released: 06/07/2007 Document Revised: 01/13/2015 Document Reviewed: 10/09/2014 °Elsevier Interactive Patient Education © 2018 [employer] ° °

## 2016-08-15 NOTE — Assessment & Plan Note (Signed)
Continue OC's continuously.

## 2016-08-15 NOTE — Progress Notes (Signed)
   Subjective:    Patient ID: [name] M Hirschfeld is a 21 y.o. female presenting with Follow-up  on 08/15/2016  HPI: Patient with long h/o pelvic pain. Has gone through pelvic PT--still doing exercises. On continuous OCs, needs refill. Last pap 08/2015 and WNL  Review of Systems  Constitutional: Negative for chills and fever.  Respiratory: Negative for shortness of breath.   Cardiovascular: Negative for chest pain.  Gastrointestinal: Negative for abdominal pain, nausea and vomiting.  Genitourinary: Negative for dysuria.  Skin: Negative for rash.      Objective:    BP 137/79   Pulse 90   Wt 138 lb (62.6 kg)   LMP 08/14/2016   BMI 23.87 kg/m  Physical Exam  Constitutional: She is oriented to person, place, and time. She appears well-developed and well-nourished. No distress.  HENT:  Head: Normocephalic and atraumatic.  Eyes: No scleral icterus.  Neck: Neck supple.  Cardiovascular: Normal rate.   Pulmonary/Chest: Effort normal.  Abdominal: Soft.  Neurological: She is alert and oriented to person, place, and time.  Skin: Skin is warm and dry.  Psychiatric: She has a normal mood and affect.        Assessment & Plan:   Problem List Items Addressed This Visit      Unprioritized   Dysmenorrhea    Continue OC's continuously.      Relevant Medications   Levonorgestrel-Ethinyl Estradiol (AMETHIA,CAMRESE) 0.15-0.03 &0.01 MG tablet   Pelvic pain in female - Primary    Continue pelvic PT exercises         Total face-to-face time with patient: 15 minutes. Over 50% of encounter was spent on counseling and coordination of care. Return in about 1 year (around 08/15/2017).  Reva Boresanya S Pratt 08/15/2016 4:24 PM

## 2016-12-22 ENCOUNTER — Ambulatory Visit (INDEPENDENT_AMBULATORY_CARE_PROVIDER_SITE_OTHER): Payer: BC Managed Care – PPO | Admitting: Family Medicine

## 2016-12-22 ENCOUNTER — Encounter: Payer: Self-pay | Admitting: Family Medicine

## 2016-12-22 VITALS — BP 118/78 | HR 90 | Temp 98.4°F | Ht 63.75 in | Wt 140.0 lb

## 2016-12-22 DIAGNOSIS — E049 Nontoxic goiter, unspecified: Secondary | ICD-10-CM

## 2016-12-22 DIAGNOSIS — F411 Generalized anxiety disorder: Secondary | ICD-10-CM

## 2016-12-22 DIAGNOSIS — E063 Autoimmune thyroiditis: Secondary | ICD-10-CM

## 2016-12-22 MED ORDER — SERTRALINE HCL 50 MG PO TABS: 50.0000 mg | ORAL_TABLET | Freq: Every day | ORAL | 3 refills | Status: DC

## 2016-12-22 MED ORDER — LEVOTHYROXINE SODIUM 25 MCG PO TABS: 25.0000 ug | ORAL_TABLET | Freq: Every day | ORAL | 3 refills | Status: DC

## 2016-12-22 NOTE — Progress Notes (Signed)
Subjective:    Patient ID: Mary Crawford, female    DOB: Dec 21, 1995, 21 y.o.   MRN: [id number]  HPI Here requesting thyroid check and for medication refill  Has been doing well  Really enjoying work  Ready for winter break   Taking good care of herself  Gets in at least 15,000 steps per day Also pelvic floor PT   Wt Readings from Last 3 Encounters:  12/22/16 140 lb (63.5 kg)  08/15/16 138 lb (62.6 kg)  04/19/16 128 lb 12 oz (58.4 kg)  eats fair-not great /makes an effort  Likes salads  24.22 kg/m   Hypothyroidism - no clinical changes /feels about the same  Pt has no clinical changes No change in energy level/ hair or skin/ edema and no tremor Lab Results  Component Value Date   TSH 1.06 01/05/2016     Hx of goiter for years and Hashimotos and saw endocrinologist  No change in that - no bigger or smaller   Lab Results  Component Value Date   CHOL 206 (H) 01/05/2016   HDL 59.10 01/05/2016   LDLCALC 127 (H) 01/05/2016   TRIG 102.0 01/05/2016   CHOLHDL 3 01/05/2016     Generalized anx disorder Takes zoloft 50 mg  Helps tremendously! Wants to stay on it  Not nearly as anxious  Able to enjoy job  Appetite and sleep are both fine  (always hungry) - snacks a lot  Patient Active Problem List   Diagnosis Date Noted  . Generalized anxiety disorder 04/20/2016  . Routine general medical examination at a health care facility 01/05/2016  . Pelvic pain in female 12/24/2014  . Dysmenorrhea 01/14/2014  . Migraine without aura 05/28/2013  . History of depression 02/10/2013  . Goiter   . History of Hashimoto thyroiditis   . Hypothyroidism, acquired, autoimmune   . Orthostatic hypotension    Past Medical History:  Diagnosis Date  . Abdominal pain, unspecified site   . 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 Depression    history of  . Dyspepsia   . Fatigue   . Goiter   . Headache    Migraines  . Hypothyroidism, acquired, autoimmune   .  Nausea with vomiting   . Orthostatic hypotension   . Pain in joint, pelvic region and thigh    right hip pain  . Pre-syncope 03/2008   cardiac referral for pre syncope 03/10  . Thyroiditis, autoimmune   . Thyrotoxicosis with Hashimoto thyroiditis   . Tobacco smoke exposure    positive hx of passive tobacco smoke exposure   Past Surgical History:  Procedure Laterality Date  . LAPAROSCOPY N/A 01/05/2015   Procedure: LAPAROSCOPY DIAGNOSTIC;  Surgeon: Reva Bores, MD;  Location: [facility];  Service: Gynecology;  Laterality: N/A;  . TONSILLECTOMY AND ADENOIDECTOMY  11/2001  . TONSILLECTOMY AND ADENOIDECTOMY     Social History   Tobacco Use  . Smoking status: Never Smoker  . Smokeless tobacco: Never Used  Substance Use Topics  . Alcohol use: Yes    Alcohol/week: 0.0 oz    Comment: occassional  . Drug use: No   Family History  Problem Relation Age of Onset  . Migraines Mother   . Thyroid disease Mother   . Heart disease Maternal Grandmother   . Diabetes Maternal Grandfather   . Diabetes Paternal Grandfather   . Thyroid disease Paternal Grandfather    Allergies  Allergen Reactions  . Aleve [Naproxen] Hives  .  Prochlorperazine Anxiety    restlessness  . Prochlorperazine Edisylate Anxiety   Current Outpatient Medications on File Prior to Visit  Medication Sig Dispense Refill  . acetaminophen (TYLENOL) 325 MG tablet Take 650 mg by mouth every 6 (six) hours as needed for mild pain or headache.    . Levonorgestrel-Ethinyl Estradiol (AMETHIA,CAMRESE) 0.15-0.03 &0.01 MG tablet Take 1 tablet by mouth daily. 3 Package 5   No current facility-administered medications on file prior to visit.     Review of Systems  Constitutional: Negative for activity change, appetite change, fatigue, fever and unexpected weight change.  HENT: Negative for congestion, ear pain, rhinorrhea, sinus pressure and sore throat.   Eyes: Negative for pain, redness and visual disturbance.  Respiratory:  Negative for cough, shortness of breath and wheezing.   Cardiovascular: Negative for chest pain and palpitations.  Gastrointestinal: Negative for abdominal pain, blood in stool, constipation and diarrhea.  Endocrine: Negative for polydipsia and polyuria.  Genitourinary: Negative for dysuria, frequency and urgency.  Musculoskeletal: Negative for arthralgias, back pain and myalgias.  Skin: Negative for pallor and rash.  Allergic/Immunologic: Negative for environmental allergies.  Neurological: Negative for dizziness, syncope and headaches.  Hematological: Negative for adenopathy. Does not bruise/bleed easily.  Psychiatric/Behavioral: Negative for decreased concentration and dysphoric mood. The patient is not nervous/anxious.        Objective:   Physical Exam  Constitutional: She appears well-developed and well-nourished. No distress.  Well appearing   HENT:  Head: Normocephalic and atraumatic.  Mouth/Throat: Oropharynx is clear and moist.  Eyes: Conjunctivae and EOM are normal. Pupils are equal, round, and reactive to light.  Neck: Normal range of motion. Neck supple. No JVD present. Carotid bruit is not present. Thyromegaly present.  Stable goiter without change   Cardiovascular: Normal rate, regular rhythm, normal heart sounds and intact distal pulses. Exam reveals no gallop.  Pulmonary/Chest: Effort normal and breath sounds normal. No respiratory distress. She has no wheezes. She has no rales.  No crackles  Abdominal: Soft. Bowel sounds are normal. She exhibits no distension, no abdominal bruit and no mass. There is no tenderness.  Musculoskeletal: She exhibits no edema.  Lymphadenopathy:    She has no cervical adenopathy.  Neurological: She is alert. She has normal reflexes. She displays no tremor. No cranial nerve deficit. She exhibits normal muscle tone. Coordination normal.  Skin: Skin is warm and dry. No rash noted. No pallor.  Psychiatric: She has a normal mood and affect. Her  mood appears not anxious. Her affect is not blunt and not labile. She does not exhibit a depressed mood.  Pleasant and talkative          Assessment & Plan:   Problem List Items Addressed This Visit      Endocrine   Goiter    No change on exam  No symptoms /swallowing problems or other  TSH today      Hypothyroidism, acquired, autoimmune - Primary    TSH today  No clinical changes No change in goiter  H/o Hashimotos in the past  Will adj levothy if needed Rev right way to take it       Relevant Orders   TSH (Completed)     Other   Generalized anxiety disorder    Doing very well with sertraline 50 mg Wants to continue it  Reviewed stressors/ coping techniques/symptoms/ support sources/ tx options and side effects in detail today Stressors are the same Good self care Disc exercise /sleep Appetite is good  Relevant Medications   sertraline (ZOLOFT) 50 MG tablet

## 2016-12-22 NOTE — Patient Instructions (Signed)
Thyroid lab today  Keep taking good care of yourself   Glad the zoloft is working so well  Keep balance work and home and self care the best you can

## 2016-12-23 LAB — TSH: TSH: 3.35 m[IU]/L

## 2016-12-23 NOTE — Assessment & Plan Note (Signed)
Doing very well with sertraline 50 mg Wants to continue it  Reviewed stressors/ coping techniques/symptoms/ support sources/ tx options and side effects in detail today Stressors are the same Good self care Disc exercise /sleep Appetite is good

## 2016-12-23 NOTE — Assessment & Plan Note (Signed)
No change on exam  No symptoms /swallowing problems or other  TSH today

## 2016-12-23 NOTE — Assessment & Plan Note (Signed)
TSH today  No clinical changes No change in goiter  H/o Hashimotos in the past  Will adj levothy if needed Rev right way to take it

## 2017-01-08 ENCOUNTER — Encounter: Payer: Self-pay | Admitting: Internal Medicine

## 2017-01-08 ENCOUNTER — Encounter: Payer: Self-pay | Admitting: *Deleted

## 2017-01-08 ENCOUNTER — Ambulatory Visit: Payer: BC Managed Care – PPO | Admitting: Internal Medicine

## 2017-01-08 VITALS — BP 106/60 | HR 98 | Temp 97.9°F | Wt 141.5 lb

## 2017-01-08 DIAGNOSIS — H60311 Diffuse otitis externa, right ear: Secondary | ICD-10-CM

## 2017-01-08 MED ORDER — CIPROFLOXACIN-DEXAMETHASONE 0.3-0.1 % OT SUSP: 4.0000 [drp] | Freq: Two times a day (BID) | OTIC | 0 refills | Status: DC

## 2017-01-08 NOTE — Patient Instructions (Signed)
Otitis Externa Otitis externa is an infection of the outer ear canal. The outer ear canal is the area between the outside of the ear and the eardrum. Otitis externa is sometimes called "swimmer's ear." Follow these instructions at home:  If you were given antibiotic ear drops, use them as told by your doctor. Do not stop using them even if your condition gets better.  Take over-the-counter and prescription medicines only as told by your doctor.  Keep all follow-up visits as told by your doctor. This is important. How is this prevented?  Keep your ear dry. Use the corner of a towel to dry your ear after you swim or bathe.  Try not to scratch or put things in your ear. Doing these things makes it easier for germs to grow in your ear.  Avoid swimming in lakes, dirty water, or pools that may not have the right amount of a chemical called chlorine.  Consider making ear drops and putting 3 or 4 drops in each ear after you swim. Ask your doctor about how you can make ear drops. Contact a doctor if:  You have a fever.  After 3 days your ear is still red, swollen, or painful.  After 3 days you still have pus coming from your ear.  Your redness, swelling, or pain gets worse.  You have a really bad headache.  You have redness, swelling, pain, or tenderness behind your ear. This information is not intended to replace advice given to you by your health care provider. Make sure you discuss any questions you have with your health care provider. Document Released: 06/07/2007 Document Revised: 01/14/2015 Document Reviewed: 09/28/2014 Elsevier Interactive Patient Education  2018 Elsevier Inc.  

## 2017-01-08 NOTE — Progress Notes (Signed)
Subjective:    Patient ID: Mary Crawford, female    DOB: 1995-04-01, 22 y.o.   MRN: [id number]  HPI  Pt presents to the clinic today with c/o right ear pain. This started 4 days ago, but seemed worse last night. She does have some nasal congestion but denies runny nose, sore throat or cough. She denies fever, chills or body aches.  She has taken Ibuprofen with some relief. She has not had sick contacts.  Review of Systems      Past Medical History:  Diagnosis Date  . Abdominal pain, unspecified site   . 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 Depression    history of  . Dyspepsia   . Fatigue   . Goiter   . Headache    Migraines  . Hypothyroidism, acquired, autoimmune   . Nausea with vomiting   . Orthostatic hypotension   . Pain in joint, pelvic region and thigh    right hip pain  . Pre-syncope 03/2008   cardiac referral for pre syncope 03/10  . Thyroiditis, autoimmune   . Thyrotoxicosis with Hashimoto thyroiditis   . Tobacco smoke exposure    positive hx of passive tobacco smoke exposure    Current Outpatient Medications  Medication Sig Dispense Refill  . acetaminophen (TYLENOL) 325 MG tablet Take 650 mg by mouth every 6 (six) hours as needed for mild pain or headache.    . Levonorgestrel-Ethinyl Estradiol (AMETHIA,CAMRESE) 0.15-0.03 &0.01 MG tablet Take 1 tablet by mouth daily. 3 Package 5  . sertraline (ZOLOFT) 50 MG tablet Take 1 tablet (50 mg total) by mouth daily. In the evening 90 tablet 3  . ciprofloxacin-dexamethasone (CIPRODEX) OTIC suspension Place 4 drops into the right ear 2 (two) times daily. 7.5 mL 0   No current facility-administered medications for this visit.     Allergies  Allergen Reactions  . Aleve [Naproxen] Hives  . Prochlorperazine Anxiety    restlessness  . Prochlorperazine Edisylate Anxiety    Family History  Problem Relation Age of Onset  . Migraines Mother   . Thyroid disease Mother   . Heart disease Maternal  Grandmother   . Diabetes Maternal Grandfather   . Diabetes Paternal Grandfather   . Thyroid disease Paternal Grandfather     Social History   Socioeconomic History  . Marital status: Single    Spouse name: Not on file  . Number of children: Not on file  . Years of education: Not on file  . Highest education level: Not on file  Social Needs  . Financial resource strain: Not on file  . Food insecurity - worry: Not on file  . Food insecurity - inability: Not on file  . Transportation needs - medical: Not on file  . Transportation needs - non-medical: Not on file  Occupational History  . Not on file  Tobacco Use  . Smoking status: Never Smoker  . Smokeless tobacco: Never Used  Substance and Sexual Activity  . Alcohol use: Yes    Alcohol/week: 0.0 oz    Comment: occassional  . Drug use: No  . Sexual activity: No    Birth control/protection: Pill  Other Topics Concern  . Not on file  Social History Narrative   Lives with parents. 12th grade at [employer].      Constitutional: Denies fever, malaise, fatigue, headache or abrupt weight changes.  HEENT: Pt reports ear pain, nasal congestion. Denies eye pain, eye redness, ringing in the ears,  wax buildup, runny nose, bloody nose, or sore throat. Respiratory: Denies difficulty breathing, shortness of breath, cough or sputum production.    No other specific complaints in a complete review of systems (except as listed in HPI above).  Objective:   Physical Exam  BP 106/60   Pulse 98   Temp 97.9 F (36.6 C) (Oral)   Wt 141 lb 8 oz (64.2 kg)   LMP 01/03/2017   SpO2 97%   BMI 24.48 kg/m  Wt Readings from Last 3 Encounters:  01/08/17 141 lb 8 oz (64.2 kg)  12/22/16 140 lb (63.5 kg)  08/15/16 138 lb (62.6 kg)    General: Appears her stated age, in NAD. HEENT: Head: normal shape and size; Left Ear: Tm's gray and intact, normal light reflex, + serous effusion; Right Ear: Canal erythematous, pus noted in canal.  Unable to visualize TM due to swelling; Nose: mucosa pink and moist, septum midline; Throat/Mouth: Teeth present, mucosa pink and moist, no exudate, lesions or ulcerations noted.  Neck:  No adenopathy noted. Pulmonary/Chest: Normal effort and positive vesicular breath sounds. No respiratory distress. No wheezes, rales or ronchi noted.    BMET    Component Value Date/Time   NA 137 01/05/2016 1545   K 3.8 01/05/2016 1545   CL 103 01/05/2016 1545   CO2 26 01/05/2016 1545   GLUCOSE 85 01/05/2016 1545   BUN 10 01/05/2016 1545   CREATININE 0.67 01/05/2016 1545   CALCIUM 9.6 01/05/2016 1545   GFRNONAA >90 01/19/2013 1312   GFRAA >90 01/19/2013 1312    Lipid Panel     Component Value Date/Time   CHOL 206 (H) 01/05/2016 1545   TRIG 102.0 01/05/2016 1545   HDL 59.10 01/05/2016 1545   CHOLHDL 3 01/05/2016 1545   VLDL 20.4 01/05/2016 1545   LDLCALC 127 (H) 01/05/2016 1545    CBC    Component Value Date/Time   WBC 6.4 01/05/2016 1545   RBC 4.64 01/05/2016 1545   HGB 13.9 01/05/2016 1545   HCT 39.9 01/05/2016 1545   PLT 285.0 01/05/2016 1545   MCV 86.0 01/05/2016 1545   MCH 30.8 12/29/2014 1400   MCHC 34.8 01/05/2016 1545   RDW 13.4 01/05/2016 1545   LYMPHSABS 2.0 01/05/2016 1545   MONOABS 0.4 01/05/2016 1545   EOSABS 0.0 01/05/2016 1545   BASOSABS 0.0 01/05/2016 1545    Hgb A1C No results found for: HGBA1C          Assessment & Plan:   Otitis Externa, Right:  Avoid getting water in your ear, put a cotton ball in the outer ear when you shower eRx for Ciprodex BID x 5-7 days Continue Ibuprofen as needed Work note provided  RTC as needed or if symptoms persist or worsen BAITY, REGINA, NP

## 2017-01-29 ENCOUNTER — Other Ambulatory Visit: Payer: Self-pay | Admitting: Family Medicine

## 2017-03-06 ENCOUNTER — Ambulatory Visit: Payer: BC Managed Care – PPO | Admitting: Internal Medicine

## 2017-03-06 ENCOUNTER — Encounter: Payer: Self-pay | Admitting: Internal Medicine

## 2017-03-06 VITALS — BP 104/68 | HR 82 | Temp 98.0°F | Wt 140.0 lb

## 2017-03-06 DIAGNOSIS — R6889 Other general symptoms and signs: Secondary | ICD-10-CM | POA: Diagnosis not present

## 2017-03-06 LAB — POC INFLUENZA A&B (BINAX/QUICKVUE)
INFLUENZA B, POC: NEGATIVE
Influenza A, POC: NEGATIVE

## 2017-03-06 MED ORDER — OSELTAMIVIR PHOSPHATE 75 MG PO CAPS: 75.0000 mg | ORAL_CAPSULE | Freq: Two times a day (BID) | ORAL | 0 refills | Status: DC

## 2017-03-06 NOTE — Progress Notes (Signed)
HPI  Pt presents to the clinic today with c/o headache, runny nose, nasal congestion, and sore throat. She reports this started 2 days ago. She is blowing clear mucous out of her nose. She denies difficulty swallowing. She denies fever, but has had fatigue, chills and body aches. She has tried Tylenol OTC. She has had sick contacts.  Review of Systems      Past Medical History:  Diagnosis Date  . Abdominal pain, unspecified site   . 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 Depression    history of  . Dyspepsia   . Fatigue   . Goiter   . Headache    Migraines  . Hypothyroidism, acquired, autoimmune   . Nausea with vomiting   . Orthostatic hypotension   . Pain in joint, pelvic region and thigh    right hip pain  . Pre-syncope 03/2008   cardiac referral for pre syncope 03/10  . Thyroiditis, autoimmune   . Thyrotoxicosis with Hashimoto thyroiditis   . Tobacco smoke exposure    positive hx of passive tobacco smoke exposure    Family History  Problem Relation Age of Onset  . Migraines Mother   . Thyroid disease Mother   . Heart disease Maternal Grandmother   . Diabetes Maternal Grandfather   . Diabetes Paternal Grandfather   . Thyroid disease Paternal Grandfather     Social History   Socioeconomic History  . Marital status: Single    Spouse name: Not on file  . Number of children: Not on file  . Years of education: Not on file  . Highest education level: Not on file  Social Needs  . Financial resource strain: Not on file  . Food insecurity - worry: Not on file  . Food insecurity - inability: Not on file  . Transportation needs - medical: Not on file  . Transportation needs - non-medical: Not on file  Occupational History  . Not on file  Tobacco Use  . Smoking status: Never Smoker  . Smokeless tobacco: Never Used  Substance and Sexual Activity  . Alcohol use: Yes    Alcohol/week: 0.0 oz    Comment: occassional  . Drug use: No  . Sexual  activity: No    Birth control/protection: Pill  Other Topics Concern  . Not on file  Social History Narrative   Lives with parents. 12th grade at [employer].     Allergies  Allergen Reactions  . Aleve [Naproxen] Hives  . Prochlorperazine Anxiety    restlessness  . Prochlorperazine Edisylate Anxiety     Constitutional: Positive headache, fatigue. Denies fever or abrupt weight changes.  HEENT:  Positive runny nose, nasal congestion, sore throat. Denies eye redness, eye pain, pressure behind the eyes, facial pain, ear pain, ringing in the ears, wax buildup, or bloody nose. Respiratory: Denies cough, difficulty breathing or shortness of breath.  Cardiovascular: Denies chest pain, chest tightness, palpitations or swelling in the hands or feet.   No other specific complaints in a complete review of systems (except as listed in HPI above).  Objective:   BP 104/68   Pulse 82   Temp 98 F (36.7 C) (Oral)   Wt 140 lb (63.5 kg)   LMP 02/20/2017   SpO2 98%   BMI 24.22 kg/m  Wt Readings from Last 3 Encounters:  03/06/17 140 lb (63.5 kg)  01/08/17 141 lb 8 oz (64.2 kg)  12/22/16 140 lb (63.5 kg)     General: Appears  her stated age, ill appearing in NAD. HEENT: Head: normal shape and size, no sinus tenderness noted; Ears: Tm's gray and intact, normal light reflex; Nose: mucosa pink and moist, septum midline; Throat/Mouth:  Teeth present, mucosa erythematous and moist, no exudate noted, no lesions or ulcerations noted.  Neck: Anterior cervical lymphadenopathy on the right.  Cardiovascular: Normal rate and rhythm. S1,S2 noted.  No murmur, rubs or gallops noted.  Pulmonary/Chest: Normal effort and positive vesicular breath sounds. No respiratory distress. No wheezes, rales or ronchi noted.       Assessment & Plan:   Flu Like Symptoms:  Rapid Flu: negative Get some rest and drink plenty of water Do salt water gargles for the sore throat Tylenol/Ibuprofen as needed for  sore throat and body aches eRx for Tamiflu 75 mg BID x 5 days Delsym as needed for cough  RTC as needed or if symptoms persist.   Nicki Reaper, NP

## 2017-03-06 NOTE — Patient Instructions (Signed)
Viral Illness, Adult Viruses are tiny germs that can get into a person's body and cause illness. There are many different types of viruses, and they cause many types of illness. Viral illnesses can range from mild to severe. They can affect various parts of the body. Common illnesses that are caused by a virus include colds and the flu. Viral illnesses also include serious conditions such as HIV/AIDS (human immunodeficiency virus/acquired immunodeficiency syndrome). A few viruses have been linked to certain cancers. What are the causes? Many types of viruses can cause illness. Viruses invade cells in your body, multiply, and cause the infected cells to malfunction or die. When the cell dies, it releases more of the virus. When this happens, you develop symptoms of the illness, and the virus continues to spread to other cells. If the virus takes over the function of the cell, it can cause the cell to divide and grow out of control, as is the case when a virus causes cancer. Different viruses get into the body in different ways. You can get a virus by:  Swallowing food or water that is contaminated with the virus.  Breathing in droplets that have been coughed or sneezed into the air by an infected person.  Touching a surface that has been contaminated with the virus and then touching your eyes, nose, or mouth.  Being bitten by an insect or animal that carries the virus.  Having sexual contact with a person who is infected with the virus.  Being exposed to blood or fluids that contain the virus, either through an open cut or during a transfusion.  If a virus enters your body, your body's defense system (immune system) will try to fight the virus. You may be at higher risk for a viral illness if your immune system is weak. What are the signs or symptoms? Symptoms vary depending on the type of virus and the location of the cells that it invades. Common symptoms of the main types of viral illnesses  include: Cold and flu viruses  Fever.  Headache.  Sore throat.  Muscle aches.  Nasal congestion.  Cough. Digestive system (gastrointestinal) viruses  Fever.  Abdominal pain.  Nausea.  Diarrhea. Liver viruses (hepatitis)  Loss of appetite.  Tiredness.  Yellowing of the skin (jaundice). Brain and spinal cord viruses  Fever.  Headache.  Stiff neck.  Nausea and vomiting.  Confusion or sleepiness. Skin viruses  Warts.  Itching.  Rash. Sexually transmitted viruses  Discharge.  Swelling.  Redness.  Rash. How is this treated? Viruses can be difficult to treat because they live within cells. Antibiotic medicines do not treat viruses because these drugs do not get inside cells. Treatment for a viral illness may include:  Resting and drinking plenty of fluids.  Medicines to relieve symptoms. These can include over-the-counter medicine for pain and fever, medicines for cough or congestion, and medicines to relieve diarrhea.  Antiviral medicines. These drugs are available only for certain types of viruses. They may help reduce flu symptoms if taken early. There are also many antiviral medicines for hepatitis and HIV/AIDS.  Some viral illnesses can be prevented with vaccinations. A common example is the flu shot. Follow these instructions at home: Medicines   Take over-the-counter and prescription medicines only as told by your health care provider.  If you were prescribed an antiviral medicine, take it as told by your health care provider. Do not stop taking the medicine even if you start to feel better.  Be aware   of when antibiotics are needed and when they are not needed. Antibiotics do not treat viruses. If your health care provider thinks that you may have a bacterial infection as well as a viral infection, you may get an antibiotic. ? Do not ask for an antibiotic prescription if you have been diagnosed with a viral illness. That will not make your  illness go away faster. ? Frequently taking antibiotics when they are not needed can lead to antibiotic resistance. When this develops, the medicine no longer works against the bacteria that it normally fights. General instructions  Drink enough fluids to keep your urine clear or pale yellow.  Rest as much as possible.  Return to your normal activities as told by your health care provider. Ask your health care provider what activities are safe for you.  Keep all follow-up visits as told by your health care provider. This is important. How is this prevented? Take these actions to reduce your risk of viral infection:  Eat a healthy diet and get enough rest.  Wash your hands often with soap and water. This is especially important when you are in public places. If soap and water are not available, use hand sanitizer.  Avoid close contact with friends and family who have a viral illness.  If you travel to areas where viral gastrointestinal infection is common, avoid drinking water or eating raw food.  Keep your immunizations up to date. Get a flu shot every year as told by your health care provider.  Do not share toothbrushes, nail clippers, razors, or needles with other people.  Always practice safe sex.  Contact a health care provider if:  You have symptoms of a viral illness that do not go away.  Your symptoms come back after going away.  Your symptoms get worse. Get help right away if:  You have trouble breathing.  You have a severe headache or a stiff neck.  You have severe vomiting or abdominal pain. This information is not intended to replace advice given to you by your health care provider. Make sure you discuss any questions you have with your health care provider. Document Released: 04/30/2015 Document Revised: 06/02/2015 Document Reviewed: 04/30/2015 Elsevier Interactive Patient Education  2018 [employer] Inc.  

## 2017-04-24 ENCOUNTER — Telehealth: Payer: Self-pay | Admitting: Family Medicine

## 2017-04-24 MED ORDER — SERTRALINE HCL 50 MG PO TABS: 50.0000 mg | ORAL_TABLET | Freq: Every day | ORAL | 2 refills | Status: DC

## 2017-04-24 NOTE — Telephone Encounter (Signed)
[facility]. [facility] [place] as requested by pt.

## 2017-04-24 NOTE — Telephone Encounter (Signed)
Copied from CRM [phone]. Topic: Quick Communication - Rx Refill/Question >> Apr 24, 2017  9:21 AM Cipriano BunkerLambe, Annette S wrote: Medication:  sertraline (ZOLOFT) 50 MG tablet  Has the patient contacted their pharmacy? Yes.   (Agent: If no, request that the patient contact the pharmacy for the refill.) Preferred Pharmacy (with phone number or street name):   [id number] [place] [place] - [address] [place] [id number] Phone: [phone] Fax: [phone]   Agent: Please be advised that RX refills may take up to 3 business days. We ask that you follow-up with your pharmacy.

## 2017-10-02 ENCOUNTER — Encounter: Payer: Self-pay | Admitting: Family Medicine

## 2017-10-02 ENCOUNTER — Ambulatory Visit: Payer: BC Managed Care – PPO | Admitting: Family Medicine

## 2017-10-02 VITALS — BP 106/70 | HR 83 | Temp 97.7°F | Ht 63.75 in | Wt 151.5 lb

## 2017-10-02 DIAGNOSIS — Z23 Encounter for immunization: Secondary | ICD-10-CM

## 2017-10-02 DIAGNOSIS — Z8659 Personal history of other mental and behavioral disorders: Secondary | ICD-10-CM

## 2017-10-02 DIAGNOSIS — F418 Other specified anxiety disorders: Secondary | ICD-10-CM

## 2017-10-02 MED ORDER — SERTRALINE HCL 100 MG PO TABS: 100.0000 mg | ORAL_TABLET | Freq: Every day | ORAL | 11 refills | Status: DC

## 2017-10-02 NOTE — Patient Instructions (Addendum)
Sertraline-increase to 1 1/2 pills once daily for 7-14 days  Once you know you tolerate it -fill the px for 100 mg pills and take one daily   If any intolerable side effects or if you feel worse please call and let me know  If suicidal - go to the closest ER   We will refer you for counseling   Try to do things that comfort you  Exercise -yoga /walking - push through if you can  Avoid alcohol or excessive caffeine  Heathy diet and lots of water  Keep talking to friends/family   Mediation is also helpful  apps for phone- Headspace/ Calm/ Smiling mind -- they are great   Follow up with me in about 4-6 weeks

## 2017-10-02 NOTE — Progress Notes (Signed)
Subjective:    Patient ID: Mary Crawford, female    DOB: 10/02/1995, 22 y.o.   MRN: [id number]  HPI Here for f/u of chronic medical problems /depression and anxiety   Wt Readings from Last 3 Encounters:  10/02/17 151 lb 8 oz (68.7 kg)  03/06/17 140 lb (63.5 kg)  01/08/17 141 lb 8 oz (64.2 kg)   26.21 kg/m   Had her flu shot today    Hypothyroidism  Pt has no clinical changes No change in energy level/ hair or skin/ edema and no tremor Lab Results  Component Value Date   TSH 3.35 12/22/2016    Does not think thyroid is off  Takes zoloft for anxiety with depression   The past month has been much worse  Depressed  Irritable Not sleeping well (either none or much too much)  More headaches and more stomach aches  Eating all the time  (weight gain- problem with wedding dress) Very tired  Hard to get out of bed   One anxiety attack - at work/ got through it   Took a few sick days   No suicidal intention - had thoughts in the past   Has never talked to a counselor   Talks to her parents/ fiancee and some supportive co workers -this is helpful  She walks for exercise  Also yoga -needs more time     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 Depression with anxiety 04/20/2016  . Routine general medical examination at a health care facility 01/05/2016  . Pelvic pain in female 12/24/2014  . Dysmenorrhea 01/14/2014  . Migraine without aura 05/28/2013  . History of depression 02/10/2013  . Goiter   . History of Hashimoto thyroiditis   . Hypothyroidism, acquired, autoimmune   . Orthostatic hypotension    Past Medical History:  Diagnosis Date  . Abdominal pain, unspecified site   . 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 Depression    history of  . Dyspepsia   . Fatigue   . Goiter   . Headache    Migraines  . Hypothyroidism, acquired, autoimmune   . Nausea with vomiting   . Orthostatic hypotension   . Pain in joint, pelvic region and thigh    right hip pain  . Pre-syncope 03/2008   cardiac referral for pre syncope 03/10  . Thyroiditis, autoimmune   . Thyrotoxicosis with Hashimoto thyroiditis   . Tobacco smoke exposure    positive hx of passive tobacco smoke exposure   Past Surgical History:  Procedure Laterality Date  . LAPAROSCOPY N/A 01/05/2015   Procedure: LAPAROSCOPY DIAGNOSTIC;  Surgeon: Reva Bores, MD;  Location: [facility];  Service: Gynecology;  Laterality: N/A;  . TONSILLECTOMY AND ADENOIDECTOMY  11/2001  . TONSILLECTOMY AND ADENOIDECTOMY     Social History   Tobacco Use  . Smoking status: Never Smoker  . Smokeless tobacco: Never Used  Substance Use Topics  . Alcohol use: Yes    Alcohol/week: 0.0 standard drinks    Comment: occassional  . Drug use: No   Family History  Problem Relation Age of Onset  . Migraines Mother   . Thyroid disease Mother   .  Heart disease Maternal Grandmother   . Diabetes Maternal Grandfather   . Diabetes Paternal Grandfather   . Thyroid disease Paternal Grandfather    Allergies  Allergen Reactions  . Aleve [Naproxen] Hives  . Prochlorperazine Anxiety    restlessness  . Prochlorperazine Edisylate Anxiety   Current Outpatient Medications on File Prior to Visit  Medication Sig Dispense Refill  . acetaminophen (TYLENOL) 325 MG tablet Take 650 mg by mouth every 6 (six) hours as needed for mild pain or headache.    . Levonorgestrel-Ethinyl Estradiol (AMETHIA,CAMRESE) 0.15-0.03 &0.01 MG tablet Take 1 tablet by mouth daily. 3 Package 5  . levothyroxine (SYNTHROID, LEVOTHROID) 25 MCG tablet TAKE 1 TABLET BY MOUTH DAILY 90 tablet 1  . sertraline (ZOLOFT) 50 MG tablet Take 1 tablet (50 mg total) by mouth daily. In the evening 90  tablet 2   No current facility-administered medications on file prior to visit.     Review of Systems  Constitutional: Positive for fatigue. Negative for activity change, appetite change, fever and unexpected weight change.  HENT: Negative for congestion, ear pain, rhinorrhea, sinus pressure and sore throat.   Eyes: Negative for pain, redness and visual disturbance.  Respiratory: Negative for cough, shortness of breath and wheezing.   Cardiovascular: Negative for chest pain and palpitations.  Gastrointestinal: Negative for abdominal pain, blood in stool, constipation and diarrhea.  Endocrine: Negative for polydipsia and polyuria.  Genitourinary: Negative for dysuria, frequency and urgency.  Musculoskeletal: Negative for arthralgias, back pain and myalgias.  Skin: Negative for pallor and rash.  Allergic/Immunologic: Negative for environmental allergies.  Neurological: Negative for dizziness, syncope and headaches.  Hematological: Negative for adenopathy. Does not bruise/bleed easily.  Psychiatric/Behavioral: Positive for decreased concentration, dysphoric mood and sleep disturbance. Negative for self-injury and suicidal ideas. The patient is nervous/anxious.        Objective:   Physical Exam  Constitutional: She appears well-developed and well-nourished. No distress.  Well appearing   HENT:  Head: Normocephalic and atraumatic.  Mouth/Throat: Oropharynx is clear and moist.  Eyes: Pupils are equal, round, and reactive to light. Conjunctivae and EOM are normal.  Neck: Normal range of motion. Neck supple. No JVD present. Carotid bruit is not present. Thyromegaly present.  Baseline thyroid exam  Cardiovascular: Normal rate, regular rhythm, normal heart sounds and intact distal pulses. Exam reveals no gallop.  Pulmonary/Chest: Effort normal and breath sounds normal. No respiratory distress. She has no wheezes. She has no rales.  No crackles  Abdominal: She exhibits no abdominal bruit.    Musculoskeletal: She exhibits no edema.  Lymphadenopathy:    She has no cervical adenopathy.  Neurological: She is alert. She has normal reflexes. She displays no tremor and normal reflexes. No cranial nerve deficit. Coordination normal.  Skin: Skin is warm and dry. No rash noted.  Psychiatric: Her speech is normal and behavior is normal. Thought content normal. Her mood appears anxious. Her affect is not blunt, not labile and not inappropriate. She is not actively hallucinating. Thought content is not paranoid. Cognition and memory are normal. She exhibits a depressed mood. She expresses no homicidal and no suicidal ideation.  Openly discusses stressors Slightly tearful  Good insight   She is attentive.          Assessment & Plan:   Problem List Items Addressed This Visit      Other   Depression with anxiety - Primary    Worsened depression lately with inc situational stress (job primarily)  Reviewed stressors/ coping techniques/symptoms/  support sources/ tx options and side effects in detail today  Disc safety- if SI- will go to ED/ reach out to [place] as well  Good support and insight Ref done for counseling to start asap  Disc coping tech -self care incl meditation/yoga/walking/talking to supporters Plan to inc sertraline to 75 mg for 1-2 wk and then if well tolerated inc to 100 mg F/u 4-6 wk  Discussed expectations of SSRI medication including time to effectiveness and mechanism of action, also poss of side effects (early and late)- including mental fuzziness, weight or appetite change, nausea and poss of worse dep or anxiety (even suicidal thoughts)  Pt voiced understanding and will stop med and update if this occurs   >25 minutes spent in face to face time with patient, >50% spent in counselling or coordination of care -including plan for self care and recommendations re: options for meditation  inst to call if worse or any concerns       Relevant Orders   Ambulatory referral  to Psychology   History of depression    Other Visit Diagnoses    Need for influenza vaccination       Relevant Orders   Flu Vaccine QUAD 6+ mos PF IM (Fluarix Quad PF) (Completed)

## 2017-10-02 NOTE — Assessment & Plan Note (Signed)
Worsened depression lately with inc situational stress (job primarily)  Reviewed stressors/ coping techniques/symptoms/ support sources/ tx options and side effects in detail today  Disc safety- if SI- will go to ED/ reach out to [place] as well  Good support and insight Ref done for counseling to start asap  Disc coping tech -self care incl meditation/yoga/walking/talking to supporters Plan to inc sertraline to 75 mg for 1-2 wk and then if well tolerated inc to 100 mg F/u 4-6 wk  Discussed expectations of SSRI medication including time to effectiveness and mechanism of action, also poss of side effects (early and late)- including mental fuzziness, weight or appetite change, nausea and poss of worse dep or anxiety (even suicidal thoughts)  Pt voiced understanding and will stop med and update if this occurs   >25 minutes spent in face to face time with patient, >50% spent in counselling or coordination of care -including plan for self care and recommendations re: options for meditation  inst to call if worse or any concerns

## 2017-10-09 ENCOUNTER — Ambulatory Visit: Payer: BC Managed Care – PPO | Admitting: Psychology

## 2017-10-09 DIAGNOSIS — F410 Panic disorder [episodic paroxysmal anxiety] without agoraphobia: Secondary | ICD-10-CM | POA: Diagnosis not present

## 2017-10-17 ENCOUNTER — Other Ambulatory Visit: Payer: Self-pay | Admitting: *Deleted

## 2017-10-17 MED ORDER — LEVOTHYROXINE SODIUM 25 MCG PO TABS: 25.0000 ug | ORAL_TABLET | Freq: Every day | ORAL | 0 refills | Status: DC

## 2017-10-30 ENCOUNTER — Ambulatory Visit: Payer: BC Managed Care – PPO | Admitting: Family Medicine

## 2017-10-30 ENCOUNTER — Encounter: Payer: Self-pay | Admitting: Family Medicine

## 2017-10-30 VITALS — BP 122/64 | HR 79 | Temp 97.9°F | Ht 63.75 in | Wt 156.5 lb

## 2017-10-30 DIAGNOSIS — F418 Other specified anxiety disorders: Secondary | ICD-10-CM

## 2017-10-30 DIAGNOSIS — B360 Pityriasis versicolor: Secondary | ICD-10-CM | POA: Insufficient documentation

## 2017-10-30 DIAGNOSIS — E063 Autoimmune thyroiditis: Secondary | ICD-10-CM | POA: Diagnosis not present

## 2017-10-30 DIAGNOSIS — N946 Dysmenorrhea, unspecified: Secondary | ICD-10-CM

## 2017-10-30 MED ORDER — LEVONORGEST-ETH ESTRAD 91-DAY 0.15-0.03 &0.01 MG PO TABS: 1.0000 | ORAL_TABLET | Freq: Every day | ORAL | 5 refills | Status: DC

## 2017-10-30 NOTE — Assessment & Plan Note (Signed)
Refilled OC which is working well  Pap utd with gyn 2017

## 2017-10-30 NOTE — Progress Notes (Signed)
Subjective:    Patient ID: Mary Crawford, female    DOB: 1995/03/28, 22 y.o.   MRN: [id number]  HPI Here for f/u of mood   Wt Readings from Last 3 Encounters:  10/30/17 156 lb 8 oz (71 kg)  10/02/17 151 lb 8 oz (68.7 kg)  03/06/17 140 lb (63.5 kg)   27.07 kg/m   Last visit c/o more anx/depression and irritability Increased sertaline to 75 and then 100 mg daily  Also ref for counseling   Thinks the medicine is helping  Tolerating well   She felt a little dizzy this weekend (spinning)  Also headache  Better now  Did not feel like her POTS   Walking for exercise and yoga stretches    One tx session  Has another tomorrow  Sees Mary Crawford   Stressors - a little better   Work-- a little better (teaching)-settling in   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 Tinea versicolor 10/30/2017  . Depression with anxiety 04/20/2016  . Routine general medical examination at a health care facility 01/05/2016  . Pelvic pain in female 12/24/2014  . Dysmenorrhea 01/14/2014  . Migraine without aura 05/28/2013  . History of depression 02/10/2013  . Goiter   . History of Hashimoto thyroiditis   . Hypothyroidism, acquired, autoimmune   . Orthostatic hypotension    Past Medical History:  Diagnosis Date  . Abdominal pain, unspecified site   . 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 Depression    history of  . Dyspepsia   . Fatigue   . Goiter     . Headache    Migraines  . Hypothyroidism, acquired, autoimmune   . Nausea with vomiting   . Orthostatic hypotension   . Pain in joint, pelvic region and thigh    right hip pain  . Pre-syncope 03/2008   cardiac referral for pre syncope 03/10  . Thyroiditis, autoimmune   . Thyrotoxicosis with Hashimoto thyroiditis   . Tobacco smoke exposure    positive hx of passive tobacco smoke exposure   Past Surgical History:  Procedure Laterality Date  . LAPAROSCOPY N/A 01/05/2015   Procedure: LAPAROSCOPY DIAGNOSTIC;  Surgeon: Reva Bores, MD;  Location: [facility];  Service: Gynecology;  Laterality: N/A;  . TONSILLECTOMY AND ADENOIDECTOMY  11/2001  . TONSILLECTOMY AND ADENOIDECTOMY     Social History   Tobacco Use  . Smoking status: Never Smoker  . Smokeless tobacco: Never Used  Substance Use Topics  . Alcohol use: Yes    Alcohol/week: 0.0 standard drinks    Comment: occassional  . Drug use: No   Family History  Problem Relation Age of Onset  . Migraines Mother   . Thyroid disease Mother   . Heart disease Maternal Grandmother   . Diabetes Maternal Grandfather   .  Diabetes Paternal Grandfather   . Thyroid disease Paternal Grandfather    Allergies  Allergen Reactions  . Aleve [Naproxen] Hives  . Prochlorperazine Anxiety    restlessness  . Prochlorperazine Edisylate Anxiety   Current Outpatient Medications on File Prior to Visit  Medication Sig Dispense Refill  . acetaminophen (TYLENOL) 325 MG tablet Take 650 mg by mouth every 6 (six) hours as needed for mild pain or headache.    . levothyroxine (SYNTHROID, LEVOTHROID) 25 MCG tablet Take 1 tablet (25 mcg total) by mouth daily. 90 tablet 0  . sertraline (ZOLOFT) 100 MG tablet Take 1 tablet (100 mg total) by mouth daily. 30 tablet 11   No current facility-administered medications on file prior to visit.      Review of Systems  Constitutional: Negative for activity change, appetite change, fatigue, fever and unexpected weight  change.  HENT: Negative for congestion, ear pain, rhinorrhea, sinus pressure and sore throat.   Eyes: Negative for pain, redness and visual disturbance.  Respiratory: Negative for cough, shortness of breath and wheezing.   Cardiovascular: Negative for chest pain and palpitations.  Gastrointestinal: Negative for abdominal pain, blood in stool, constipation and diarrhea.  Endocrine: Negative for polydipsia and polyuria.  Genitourinary: Negative for dysuria, frequency and urgency.  Musculoskeletal: Negative for arthralgias, back pain and myalgias.       R knee hurt this week  Skin: Positive for rash. Negative for pallor.  Allergic/Immunologic: Negative for environmental allergies.  Neurological: Negative for dizziness, syncope and headaches.  Hematological: Negative for adenopathy. Does not bruise/bleed easily.  Psychiatric/Behavioral: Positive for dysphoric mood and sleep disturbance. Negative for decreased concentration. The patient is nervous/anxious.        Mood is improved overall       Objective:   Physical Exam  Constitutional: She appears well-developed and well-nourished. No distress.  Well appearing   HENT:  Head: Normocephalic and atraumatic.  Mouth/Throat: Oropharynx is clear and moist.  Eyes: Pupils are equal, round, and reactive to light. Conjunctivae and EOM are normal. Right eye exhibits no discharge. Left eye exhibits no discharge. No scleral icterus.  Neck: Normal range of motion. Neck supple.  No change in goiter  Cardiovascular: Normal rate, regular rhythm and normal heart sounds.  Pulmonary/Chest: Effort normal and breath sounds normal. No stridor. No respiratory distress. She has no wheezes.  Abdominal:  No suprapubic tenderness or fullness    Musculoskeletal: She exhibits no edema, tenderness or deformity.  Lymphadenopathy:    She has no cervical adenopathy.  Neurological: She is alert. She displays no tremor and normal reflexes. No cranial nerve deficit.  Coordination normal.  Skin: Skin is warm and dry. Rash noted.  Findings of tinea versicolor (macular rash- spots of hypopigmentation mostly oval in shape) on upper back  Psychiatric: Her speech is normal and behavior is normal. Thought content normal. Her affect is not labile and not inappropriate.  Improved affect  Less anxious talkative          Assessment & Plan:   Problem List Items Addressed This Visit      Endocrine   Hypothyroidism, acquired, autoimmune    TSH today  No clinical changes (except for mood) Refill px after results return  No missed doses of levothyroxine       Relevant Orders   TSH     Musculoskeletal and Integument   Tinea versicolor    On upper back  Not symptomatic  Given handout on condition Recommend dandruff shampoo twice weekly to  affected areas Alert if no improvement         Genitourinary   Dysmenorrhea    Refilled OC which is working well  Pap utd with gyn 2017      Relevant Medications   Levonorgestrel-Ethinyl Estradiol (AMETHIA,CAMRESE) 0.15-0.03 &0.01 MG tablet     Other   Depression with anxiety - Primary    Some improvement with inc of sertraline to 100 mg and also counseling  Reviewed stressors/ coping techniques/symptoms/ support sources/ tx options and side effects in detail today Has next tx visit tomorrow Stressed imp of good self care  Stressors are slt better No SI

## 2017-10-30 NOTE — Patient Instructions (Addendum)
Use dandruff shampoo (selsun blue or head and shoulders)- on body several times weekly  Take a look at the handout   We will check thyroid today   Continue your current oral contraception   No change in sertraline- I'm glad you are doing little better Continue seeing Synetta Fail for counseling also   Take care of yourself Continue exercising

## 2017-10-30 NOTE — Assessment & Plan Note (Signed)
TSH today  No clinical changes (except for mood) Refill px after results return  No missed doses of levothyroxine

## 2017-10-30 NOTE — Assessment & Plan Note (Signed)
On upper back  Not symptomatic  Given handout on condition Recommend dandruff shampoo twice weekly to affected areas Alert if no improvement

## 2017-10-30 NOTE — Assessment & Plan Note (Signed)
Some improvement with inc of sertraline to 100 mg and also counseling  Reviewed stressors/ coping techniques/symptoms/ support sources/ tx options and side effects in detail today Has next tx visit tomorrow Stressed imp of good self care  Stressors are slt better No SI

## 2017-10-31 ENCOUNTER — Ambulatory Visit: Payer: BC Managed Care – PPO | Admitting: Psychology

## 2017-10-31 DIAGNOSIS — F410 Panic disorder [episodic paroxysmal anxiety] without agoraphobia: Secondary | ICD-10-CM

## 2017-10-31 LAB — TSH: TSH: 2.67 u[IU]/mL (ref 0.35–4.50)

## 2017-11-09 ENCOUNTER — Other Ambulatory Visit: Payer: Self-pay | Admitting: Family Medicine

## 2017-11-13 ENCOUNTER — Ambulatory Visit: Payer: BC Managed Care – PPO | Admitting: Psychology

## 2017-11-13 DIAGNOSIS — F410 Panic disorder [episodic paroxysmal anxiety] without agoraphobia: Secondary | ICD-10-CM | POA: Diagnosis not present

## 2017-11-27 ENCOUNTER — Ambulatory Visit: Payer: BC Managed Care – PPO | Admitting: Psychology

## 2017-11-27 DIAGNOSIS — F410 Panic disorder [episodic paroxysmal anxiety] without agoraphobia: Secondary | ICD-10-CM

## 2017-12-24 ENCOUNTER — Ambulatory Visit: Payer: BC Managed Care – PPO | Admitting: Psychology

## 2017-12-24 DIAGNOSIS — F410 Panic disorder [episodic paroxysmal anxiety] without agoraphobia: Secondary | ICD-10-CM | POA: Diagnosis not present

## 2018-01-16 ENCOUNTER — Other Ambulatory Visit: Payer: Self-pay | Admitting: Family Medicine

## 2018-01-16 MED ORDER — LEVOTHYROXINE SODIUM 25 MCG PO TABS: 25.0000 ug | ORAL_TABLET | Freq: Every day | ORAL | 2 refills | Status: DC

## 2018-01-25 ENCOUNTER — Ambulatory Visit: Payer: BC Managed Care – PPO | Admitting: Psychology

## 2018-02-05 ENCOUNTER — Ambulatory Visit: Payer: BC Managed Care – PPO | Admitting: Psychology

## 2018-02-18 ENCOUNTER — Ambulatory Visit: Payer: BC Managed Care – PPO | Admitting: Psychology

## 2018-02-18 DIAGNOSIS — F410 Panic disorder [episodic paroxysmal anxiety] without agoraphobia: Secondary | ICD-10-CM

## 2018-04-17 ENCOUNTER — Ambulatory Visit (INDEPENDENT_AMBULATORY_CARE_PROVIDER_SITE_OTHER): Payer: BC Managed Care – PPO | Admitting: Psychology

## 2018-04-17 DIAGNOSIS — F410 Panic disorder [episodic paroxysmal anxiety] without agoraphobia: Secondary | ICD-10-CM

## 2018-04-30 ENCOUNTER — Ambulatory Visit: Payer: Self-pay | Admitting: Psychology

## 2018-05-14 ENCOUNTER — Ambulatory Visit: Payer: BC Managed Care – PPO | Admitting: Psychology

## 2018-05-17 ENCOUNTER — Ambulatory Visit (INDEPENDENT_AMBULATORY_CARE_PROVIDER_SITE_OTHER): Payer: Self-pay | Admitting: Psychology

## 2018-05-17 DIAGNOSIS — F410 Panic disorder [episodic paroxysmal anxiety] without agoraphobia: Secondary | ICD-10-CM

## 2018-05-28 ENCOUNTER — Ambulatory Visit (INDEPENDENT_AMBULATORY_CARE_PROVIDER_SITE_OTHER): Payer: BC Managed Care – PPO | Admitting: Psychology

## 2018-05-28 DIAGNOSIS — F410 Panic disorder [episodic paroxysmal anxiety] without agoraphobia: Secondary | ICD-10-CM

## 2018-06-18 ENCOUNTER — Ambulatory Visit (INDEPENDENT_AMBULATORY_CARE_PROVIDER_SITE_OTHER): Payer: BC Managed Care – PPO | Admitting: Psychology

## 2018-06-18 DIAGNOSIS — F410 Panic disorder [episodic paroxysmal anxiety] without agoraphobia: Secondary | ICD-10-CM | POA: Diagnosis not present

## 2018-07-02 ENCOUNTER — Ambulatory Visit (INDEPENDENT_AMBULATORY_CARE_PROVIDER_SITE_OTHER): Payer: BC Managed Care – PPO | Admitting: Psychology

## 2018-07-02 DIAGNOSIS — F410 Panic disorder [episodic paroxysmal anxiety] without agoraphobia: Secondary | ICD-10-CM | POA: Diagnosis not present

## 2018-07-23 ENCOUNTER — Ambulatory Visit (INDEPENDENT_AMBULATORY_CARE_PROVIDER_SITE_OTHER): Payer: Self-pay | Admitting: Psychology

## 2018-07-23 DIAGNOSIS — F410 Panic disorder [episodic paroxysmal anxiety] without agoraphobia: Secondary | ICD-10-CM

## 2018-08-06 ENCOUNTER — Ambulatory Visit: Payer: BC Managed Care – PPO | Admitting: Psychology

## 2018-08-08 ENCOUNTER — Ambulatory Visit (INDEPENDENT_AMBULATORY_CARE_PROVIDER_SITE_OTHER): Payer: BC Managed Care – PPO | Admitting: Psychology

## 2018-08-08 DIAGNOSIS — F410 Panic disorder [episodic paroxysmal anxiety] without agoraphobia: Secondary | ICD-10-CM | POA: Diagnosis not present

## 2018-08-20 ENCOUNTER — Ambulatory Visit (INDEPENDENT_AMBULATORY_CARE_PROVIDER_SITE_OTHER): Payer: Self-pay | Admitting: Psychology

## 2018-08-20 DIAGNOSIS — F410 Panic disorder [episodic paroxysmal anxiety] without agoraphobia: Secondary | ICD-10-CM

## 2018-09-02 ENCOUNTER — Ambulatory Visit: Payer: BC Managed Care – PPO | Admitting: Psychology

## 2018-09-04 ENCOUNTER — Ambulatory Visit (INDEPENDENT_AMBULATORY_CARE_PROVIDER_SITE_OTHER): Payer: Self-pay | Admitting: Psychology

## 2018-09-04 DIAGNOSIS — F410 Panic disorder [episodic paroxysmal anxiety] without agoraphobia: Secondary | ICD-10-CM

## 2018-09-17 ENCOUNTER — Ambulatory Visit: Payer: BC Managed Care – PPO | Admitting: Psychology

## 2018-09-23 ENCOUNTER — Telehealth: Payer: Self-pay | Admitting: Family Medicine

## 2018-09-24 NOTE — Telephone Encounter (Signed)
Please schedule f/u in November  I refilled px

## 2018-09-24 NOTE — Telephone Encounter (Signed)
LOV 10/30/2017. When should patient follow up?

## 2018-09-25 ENCOUNTER — Ambulatory Visit (INDEPENDENT_AMBULATORY_CARE_PROVIDER_SITE_OTHER): Payer: Self-pay | Admitting: Psychology

## 2018-09-25 DIAGNOSIS — F410 Panic disorder [episodic paroxysmal anxiety] without agoraphobia: Secondary | ICD-10-CM

## 2018-09-26 NOTE — Telephone Encounter (Signed)
I left a detailed message on patient's voice mail to let her know her thyroid medication was refilled and she needs to call to schedule follow up appointment with Dr.Tower in November.

## 2018-10-01 ENCOUNTER — Ambulatory Visit: Payer: BC Managed Care – PPO | Admitting: Psychology

## 2018-10-09 ENCOUNTER — Ambulatory Visit: Payer: BC Managed Care – PPO | Admitting: Psychology

## 2018-10-15 ENCOUNTER — Ambulatory Visit: Payer: BC Managed Care – PPO | Admitting: Psychology

## 2018-10-23 ENCOUNTER — Ambulatory Visit: Payer: BC Managed Care – PPO | Admitting: Psychology

## 2018-10-29 ENCOUNTER — Telehealth: Payer: Self-pay | Admitting: Family Medicine

## 2018-10-29 ENCOUNTER — Other Ambulatory Visit: Payer: Self-pay | Admitting: *Deleted

## 2018-10-29 MED ORDER — SERTRALINE HCL 100 MG PO TABS: 100.0000 mg | ORAL_TABLET | Freq: Every day | ORAL | 0 refills | Status: DC

## 2018-10-29 NOTE — Telephone Encounter (Signed)
Patient called today to reschedule appointment for tomorrow. She stated she is having covid like symptoms and went yesterday for a covid testing.   Patient rescheduled for 2 weeks out and will call back if she is still having symptoms

## 2018-10-29 NOTE — Telephone Encounter (Signed)
Aware, thanks!

## 2018-10-30 ENCOUNTER — Ambulatory Visit: Payer: BC Managed Care – PPO | Admitting: Family Medicine

## 2018-11-06 ENCOUNTER — Ambulatory Visit (INDEPENDENT_AMBULATORY_CARE_PROVIDER_SITE_OTHER): Payer: BC Managed Care – PPO | Admitting: Psychology

## 2018-11-06 DIAGNOSIS — F410 Panic disorder [episodic paroxysmal anxiety] without agoraphobia: Secondary | ICD-10-CM

## 2018-11-13 ENCOUNTER — Encounter: Payer: Self-pay | Admitting: Family Medicine

## 2018-11-13 ENCOUNTER — Ambulatory Visit: Payer: BC Managed Care – PPO | Admitting: Family Medicine

## 2018-11-13 ENCOUNTER — Other Ambulatory Visit: Payer: Self-pay

## 2018-11-13 VITALS — BP 122/76 | HR 111 | Temp 97.4°F | Ht 63.75 in | Wt 180.4 lb

## 2018-11-13 DIAGNOSIS — E049 Nontoxic goiter, unspecified: Secondary | ICD-10-CM

## 2018-11-13 DIAGNOSIS — N946 Dysmenorrhea, unspecified: Secondary | ICD-10-CM

## 2018-11-13 DIAGNOSIS — F418 Other specified anxiety disorders: Secondary | ICD-10-CM

## 2018-11-13 DIAGNOSIS — R5382 Chronic fatigue, unspecified: Secondary | ICD-10-CM | POA: Diagnosis not present

## 2018-11-13 DIAGNOSIS — E063 Autoimmune thyroiditis: Secondary | ICD-10-CM

## 2018-11-13 DIAGNOSIS — E785 Hyperlipidemia, unspecified: Secondary | ICD-10-CM | POA: Insufficient documentation

## 2018-11-13 DIAGNOSIS — Z13220 Encounter for screening for lipoid disorders: Secondary | ICD-10-CM

## 2018-11-13 LAB — COMPREHENSIVE METABOLIC PANEL
ALT: 9 U/L (ref 0–35)
AST: 15 U/L (ref 0–37)
Albumin: 4.3 g/dL (ref 3.5–5.2)
Alkaline Phosphatase: 54 U/L (ref 39–117)
BUN: 14 mg/dL (ref 6–23)
CO2: 26 mEq/L (ref 19–32)
Calcium: 9.3 mg/dL (ref 8.4–10.5)
Chloride: 103 mEq/L (ref 96–112)
Creatinine, Ser: 0.69 mg/dL (ref 0.40–1.20)
GFR: 103.59 mL/min (ref >60.00)
Glucose, Bld: 101 mg/dL — ABNORMAL HIGH (ref 70–99)
Potassium: 3.8 mEq/L (ref 3.5–5.1)
Sodium: 137 mEq/L (ref 135–145)
Total Bilirubin: 0.2 mg/dL (ref 0.2–1.2)
Total Protein: 7 g/dL (ref 6.0–8.3)

## 2018-11-13 LAB — CBC WITH DIFFERENTIAL/PLATELET
Basophils Absolute: 0 10*3/uL (ref 0.0–0.1)
Basophils Relative: 0.6 % (ref 0.0–3.0)
Eosinophils Absolute: 0 10*3/uL (ref 0.0–0.7)
Eosinophils Relative: 0.5 % (ref 0.0–5.0)
HCT: 40.4 % (ref 36.0–46.0)
Hemoglobin: 13.7 g/dL (ref 12.0–15.0)
Lymphocytes Relative: 25 % (ref 12.0–46.0)
Lymphs Abs: 1.9 10*3/uL (ref 0.7–4.0)
MCHC: 33.9 g/dL (ref 30.0–36.0)
MCV: 86.2 fl (ref 78.0–100.0)
Monocytes Absolute: 0.5 10*3/uL (ref 0.1–1.0)
Monocytes Relative: 7.1 % (ref 3.0–12.0)
Neutro Abs: 5.1 10*3/uL (ref 1.4–7.7)
Neutrophils Relative %: 66.8 % (ref 43.0–77.0)
Platelets: 301 10*3/uL (ref 150.0–400.0)
RBC: 4.69 Mil/uL (ref 3.87–5.11)
RDW: 13.1 % (ref 11.5–15.5)
WBC: 7.7 10*3/uL (ref 4.0–10.5)

## 2018-11-13 LAB — LIPID PANEL
Cholesterol: 231 mg/dL — ABNORMAL HIGH (ref 0–200)
HDL: 60.7 mg/dL (ref >39.00)
LDL Cholesterol: 142 mg/dL — ABNORMAL HIGH (ref 0–99)
NonHDL: 170.28
Total CHOL/HDL Ratio: 4
Triglycerides: 142 mg/dL (ref 0.0–149.0)
VLDL: 28.4 mg/dL (ref 0.0–40.0)

## 2018-11-13 LAB — TSH: TSH: 2.27 u[IU]/mL (ref 0.35–4.50)

## 2018-11-13 MED ORDER — LEVOTHYROXINE SODIUM 25 MCG PO TABS: 25.0000 ug | ORAL_TABLET | Freq: Every day | ORAL | 3 refills | Status: DC

## 2018-11-13 MED ORDER — LEVONORGEST-ETH ESTRAD 91-DAY 0.15-0.03 &0.01 MG PO TABS: 1.0000 | ORAL_TABLET | Freq: Every day | ORAL | 3 refills | Status: DC

## 2018-11-13 MED ORDER — SERTRALINE HCL 100 MG PO TABS: 100.0000 mg | ORAL_TABLET | Freq: Every day | ORAL | 3 refills | Status: DC

## 2018-11-13 NOTE — Assessment & Plan Note (Signed)
Sertraline has helped greatly  Reviewed stressors/ coping techniques/symptoms/ support sources/ tx options and side effects in detail today Some inc anx during pandemic  Thinks zoloft inc her appetite-but she prefers to stay on it since it helps so much Enc good self care including exercise

## 2018-11-13 NOTE — Assessment & Plan Note (Signed)
No changes per pt or on exam

## 2018-11-13 NOTE — Assessment & Plan Note (Signed)
No clinical changes besides wt gain  TSH today  Currently taking levothy 25 mcg daily  Thyroid size seems unchanged

## 2018-11-13 NOTE — Assessment & Plan Note (Signed)
Chronic/ongoing  Possibly multifactorial (does not think her thyroid has changed however) Labs today Enc exercise and good self care

## 2018-11-13 NOTE — Assessment & Plan Note (Signed)
Doing well with current OC  Has w/d bleed every 3 mo Plans to f/u with gyn for pap/exam in January  (offered to do exam today but she had menses)

## 2018-11-13 NOTE — Assessment & Plan Note (Signed)
Lipid panel with lab today Enc healthy low trans/sat fat diet

## 2018-11-13 NOTE — Progress Notes (Signed)
Subjective:    Patient ID: Mary Crawford, female    DOB: January 10, 1995, 23 y.o.   MRN: [id number]  HPI  Here for f/u of hypothyroidism   Got married  Now works at [employer]-now on [employer] after being on line  She is a Tax adviser7th grade teacher  She misses seeing the kids  Trying to make the best of the situation   Doing well in general   Wt Readings from Last 3 Encounters:  11/13/18 180 lb 7 oz (81.8 kg)  10/30/17 156 lb 8 oz (71 kg)  10/02/17 151 lb 8 oz (68.7 kg)  she wants to loose weight  Difficult to get off the computer  Ate too much with quarantine   (zoloft also makes her hungry) 31.22 kg/m   Knees have been hurting lately  She can make time to walk   Lab Results  Component Value Date   TSH 2.67 10/30/2017   takes 25 mcg of levothyroxine daily  No changes except wt gain  She has always been cold natured   Mood  PHQ score 13  Anxiety up and down  Doing counseling with Synetta Failnita - that is helpful   Sertraline for anx/depression  Very helpful   OC for contraception - no problems  Periods are every 3 months -no big problems  Last pap 8/17 Dr Shawnie PonsPratt No plans for pregnancy soon  No abn paps   Will schedule one early next year with gyn   On menses today    Pulse Readings from Last 3 Encounters:  11/13/18 (!) 111  10/30/17 79  10/02/17 83  pt gets nervous on office States avg pulse is in the 70s with her fitness watch at home   BP Readings from Last 3 Encounters:  11/13/18 122/76  10/30/17 122/64  10/02/17 106/70   Patient Active Problem List   Diagnosis Date Noted  . Encounter for screening for lipoid disorders 11/13/2018  . Depression with anxiety 04/20/2016  . Routine general medical examination at a health care facility 01/05/2016  . Pelvic pain in female 12/24/2014  . Dysmenorrhea 01/14/2014  . Migraine without aura 05/28/2013  . History of depression 02/10/2013  . Goiter   . History of Hashimoto thyroiditis   . Hypothyroidism,  acquired, autoimmune   . Fatigue   . Orthostatic hypotension    Past Medical History:  Diagnosis Date  . Abdominal pain, unspecified site   . 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 Depression    history of  . Dyspepsia   . Fatigue   . Goiter   . Headache    Migraines  . Hypothyroidism, acquired, autoimmune   . Nausea with vomiting   . Orthostatic hypotension   . Pain in joint, pelvic region and thigh    right hip pain  . Pre-syncope 03/2008   cardiac referral for pre syncope 03/10  . Thyroiditis, autoimmune   . Thyrotoxicosis with Hashimoto thyroiditis   . Tobacco smoke exposure    positive hx of passive tobacco smoke exposure   Past Surgical History:  Procedure Laterality Date  . LAPAROSCOPY N/A 01/05/2015   Procedure: LAPAROSCOPY DIAGNOSTIC;  Surgeon: Reva Boresanya S Pratt, MD;  Location: [facility];  Service: Gynecology;  Laterality: N/A;  . TONSILLECTOMY AND ADENOIDECTOMY  11/2001  . TONSILLECTOMY AND ADENOIDECTOMY     Social History   Tobacco Use  . Smoking status: Never Smoker  . Smokeless tobacco: Never Used  Substance Use Topics  .  Alcohol use: Yes    Alcohol/week: 0.0 standard drinks    Comment: occassional  . Drug use: No   Family History  Problem Relation Age of Onset  . Migraines Mother   . Thyroid disease Mother   . Heart disease Maternal Grandmother   . Diabetes Maternal Grandfather   . Diabetes Paternal Grandfather   . Thyroid disease Paternal Grandfather    Allergies  Allergen Reactions  . Aleve [Naproxen] Hives  . Prochlorperazine Anxiety    restlessness  . Prochlorperazine Edisylate Anxiety   Current Outpatient Medications on File Prior to Visit  Medication Sig Dispense Refill  . acetaminophen (TYLENOL) 325 MG tablet Take 650 mg by mouth every 6 (six) hours as needed for mild pain or headache.     No current facility-administered medications on file prior to visit.     Review of Systems  Constitutional: Positive for appetite  change and fatigue. Negative for activity change, fever and unexpected weight change.  HENT: Negative for congestion, ear pain, rhinorrhea, sinus pressure and sore throat.   Eyes: Negative for pain, redness and visual disturbance.  Respiratory: Negative for cough, shortness of breath and wheezing.   Cardiovascular: Negative for chest pain and palpitations.  Gastrointestinal: Negative for abdominal pain, blood in stool, constipation and diarrhea.  Endocrine: Negative for polydipsia and polyuria.  Genitourinary: Negative for dysuria, frequency and urgency.  Musculoskeletal: Negative for arthralgias, back pain and myalgias.  Skin: Negative for pallor and rash.  Allergic/Immunologic: Negative for environmental allergies.  Neurological: Negative for dizziness, syncope and headaches.  Hematological: Negative for adenopathy. Does not bruise/bleed easily.  Psychiatric/Behavioral: Negative for decreased concentration and dysphoric mood. The patient is nervous/anxious.        Objective:   Physical Exam Constitutional:      General: She is not in acute distress.    Appearance: Normal appearance. She is well-developed. She is obese. She is not ill-appearing or diaphoretic.  HENT:     Head: Normocephalic and atraumatic.     Mouth/Throat:     Mouth: Mucous membranes are moist.  Eyes:     General: No scleral icterus.    Conjunctiva/sclera: Conjunctivae normal.     Pupils: Pupils are equal, round, and reactive to light.  Neck:     Musculoskeletal: Normal range of motion and neck supple. No neck rigidity or muscular tenderness.     Thyroid: No thyromegaly.     Vascular: No carotid bruit or JVD.     Comments: Baseline thyroid enl  No change  No tenderness No thyroid bruit Cardiovascular:     Rate and Rhythm: Regular rhythm. Tachycardia present.     Heart sounds: Normal heart sounds. No gallop.   Pulmonary:     Effort: Pulmonary effort is normal. No respiratory distress.     Breath sounds:  Normal breath sounds. No wheezing or rales.  Abdominal:     General: Bowel sounds are normal. There is no distension or abdominal bruit.     Palpations: Abdomen is soft. There is no mass.     Tenderness: There is no abdominal tenderness.  Musculoskeletal:     Right lower leg: No edema.     Left lower leg: No edema.  Lymphadenopathy:     Cervical: No cervical adenopathy.  Skin:    General: Skin is warm and dry.     Coloration: Skin is not pale.     Findings: No erythema or rash.  Neurological:     Mental Status: She is alert.  Cranial Nerves: Cranial nerves are intact.     Motor: No tremor.     Coordination: Coordination normal.     Deep Tendon Reflexes: Reflexes are normal and symmetric. Reflexes normal.  Psychiatric:        Mood and Affect: Mood normal.     Comments: Pleasant            Assessment & Plan:   Problem List Items Addressed This Visit      Endocrine   Goiter    No changes per pt or on exam      Relevant Medications   levothyroxine (SYNTHROID) 25 MCG tablet   Hypothyroidism, acquired, autoimmune - Primary    No clinical changes besides wt gain  TSH today  Currently taking levothy 25 mcg daily  Thyroid size seems unchanged      Relevant Medications   levothyroxine (SYNTHROID) 25 MCG tablet   Other Relevant Orders   TSH (Completed)     Genitourinary   Dysmenorrhea    Doing well with current OC  Has w/d bleed every 3 mo Plans to f/u with gyn for pap/exam in January  (offered to do exam today but she had menses)       Relevant Medications   Levonorgestrel-Ethinyl Estradiol (AMETHIA) 0.15-0.03 &0.01 MG tablet     Other   Fatigue    Chronic/ongoing  Possibly multifactorial (does not think her thyroid has changed however) Labs today Enc exercise and good self care      Relevant Orders   Comprehensive metabolic panel (Completed)   CBC w/Diff (Completed)   TSH (Completed)   Depression with anxiety    Sertraline has helped greatly   Reviewed stressors/ coping techniques/symptoms/ support sources/ tx options and side effects in detail today Some inc anx during pandemic  Thinks zoloft inc her appetite-but she prefers to stay on it since it helps so much Enc good self care including exercise      Relevant Medications   sertraline (ZOLOFT) 100 MG tablet   Encounter for screening for lipoid disorders    Lipid panel with lab today Enc healthy low trans/sat fat diet       Relevant Orders   Lipid panel (Completed)

## 2018-11-13 NOTE — Patient Instructions (Addendum)
Start some walking and some on line classes for exercise  Start to budget 30 min or more per day   Eat a healthy diet  Don't buy junk food   Labs today for thyroid and fatigue and cholesterol

## 2018-11-15 ENCOUNTER — Encounter: Payer: Self-pay | Admitting: *Deleted

## 2018-11-20 ENCOUNTER — Ambulatory Visit (INDEPENDENT_AMBULATORY_CARE_PROVIDER_SITE_OTHER): Payer: BC Managed Care – PPO | Admitting: Psychology

## 2018-11-20 DIAGNOSIS — F410 Panic disorder [episodic paroxysmal anxiety] without agoraphobia: Secondary | ICD-10-CM | POA: Diagnosis not present

## 2018-12-04 ENCOUNTER — Ambulatory Visit (INDEPENDENT_AMBULATORY_CARE_PROVIDER_SITE_OTHER): Payer: BC Managed Care – PPO | Admitting: Psychology

## 2018-12-04 DIAGNOSIS — F410 Panic disorder [episodic paroxysmal anxiety] without agoraphobia: Secondary | ICD-10-CM | POA: Diagnosis not present

## 2018-12-18 ENCOUNTER — Ambulatory Visit (INDEPENDENT_AMBULATORY_CARE_PROVIDER_SITE_OTHER): Payer: BC Managed Care – PPO | Admitting: Psychology

## 2018-12-18 DIAGNOSIS — F410 Panic disorder [episodic paroxysmal anxiety] without agoraphobia: Secondary | ICD-10-CM

## 2019-01-01 ENCOUNTER — Ambulatory Visit (INDEPENDENT_AMBULATORY_CARE_PROVIDER_SITE_OTHER): Payer: BC Managed Care – PPO | Admitting: Psychology

## 2019-01-01 DIAGNOSIS — F410 Panic disorder [episodic paroxysmal anxiety] without agoraphobia: Secondary | ICD-10-CM | POA: Diagnosis not present

## 2019-01-15 ENCOUNTER — Ambulatory Visit (INDEPENDENT_AMBULATORY_CARE_PROVIDER_SITE_OTHER): Payer: BC Managed Care – PPO | Admitting: Psychology

## 2019-01-15 DIAGNOSIS — F410 Panic disorder [episodic paroxysmal anxiety] without agoraphobia: Secondary | ICD-10-CM

## 2019-01-28 ENCOUNTER — Other Ambulatory Visit: Payer: Self-pay | Admitting: *Deleted

## 2019-01-28 DIAGNOSIS — N946 Dysmenorrhea, unspecified: Secondary | ICD-10-CM

## 2019-01-28 MED ORDER — LEVONORGEST-ETH ESTRAD 91-DAY 0.15-0.03 &0.01 MG PO TABS: 1.0000 | ORAL_TABLET | Freq: Every day | ORAL | 1 refills | Status: DC

## 2019-01-29 ENCOUNTER — Ambulatory Visit (INDEPENDENT_AMBULATORY_CARE_PROVIDER_SITE_OTHER): Payer: BC Managed Care – PPO | Admitting: Psychology

## 2019-01-29 DIAGNOSIS — F410 Panic disorder [episodic paroxysmal anxiety] without agoraphobia: Secondary | ICD-10-CM

## 2019-02-12 ENCOUNTER — Ambulatory Visit (INDEPENDENT_AMBULATORY_CARE_PROVIDER_SITE_OTHER): Payer: BC Managed Care – PPO | Admitting: Psychology

## 2019-02-12 DIAGNOSIS — F410 Panic disorder [episodic paroxysmal anxiety] without agoraphobia: Secondary | ICD-10-CM | POA: Diagnosis not present

## 2019-02-26 ENCOUNTER — Ambulatory Visit (INDEPENDENT_AMBULATORY_CARE_PROVIDER_SITE_OTHER): Payer: BC Managed Care – PPO | Admitting: Psychology

## 2019-02-26 DIAGNOSIS — F410 Panic disorder [episodic paroxysmal anxiety] without agoraphobia: Secondary | ICD-10-CM | POA: Diagnosis not present

## 2019-03-09 ENCOUNTER — Ambulatory Visit: Payer: BC Managed Care – PPO | Attending: Internal Medicine

## 2019-03-09 DIAGNOSIS — Z23 Encounter for immunization: Secondary | ICD-10-CM | POA: Insufficient documentation

## 2019-03-09 NOTE — Progress Notes (Signed)
   Covid-19 Vaccination Clinic  Name:  Mary Crawford    MRN: [id number] DOB: 1995-09-20  03/09/2019  Mary Crawford was observed post Covid-19 immunization for 15 minutes without incident. She was provided with Vaccine Information Sheet and instruction to access the V-Safe system.   Mary Crawford was instructed to call 911 with any severe reactions post vaccine: Marland Kitchen Difficulty breathing  . Swelling of face and throat  . A fast heartbeat  . A bad rash all over body  . Dizziness and weakness   Immunizations Administered    Name Date Dose VIS Date Route   Pfizer COVID-19 Vaccine 03/09/2019  8:42 AM 0.3 mL 12/13/2018 Intramuscular   Manufacturer: [employer], [employer]   Lot: [id number]   NDC: [address]-3

## 2019-03-12 ENCOUNTER — Ambulatory Visit (INDEPENDENT_AMBULATORY_CARE_PROVIDER_SITE_OTHER): Payer: BC Managed Care – PPO | Admitting: Psychology

## 2019-03-12 DIAGNOSIS — F410 Panic disorder [episodic paroxysmal anxiety] without agoraphobia: Secondary | ICD-10-CM | POA: Diagnosis not present

## 2019-03-26 ENCOUNTER — Ambulatory Visit (INDEPENDENT_AMBULATORY_CARE_PROVIDER_SITE_OTHER): Payer: BC Managed Care – PPO | Admitting: Psychology

## 2019-03-26 DIAGNOSIS — F410 Panic disorder [episodic paroxysmal anxiety] without agoraphobia: Secondary | ICD-10-CM | POA: Diagnosis not present

## 2019-03-30 ENCOUNTER — Ambulatory Visit: Payer: BC Managed Care – PPO | Attending: Internal Medicine

## 2019-03-30 ENCOUNTER — Ambulatory Visit: Payer: Self-pay

## 2019-03-30 DIAGNOSIS — Z23 Encounter for immunization: Secondary | ICD-10-CM

## 2019-03-30 NOTE — Progress Notes (Signed)
   Covid-19 Vaccination Clinic  Name:  Mary Crawford    MRN: [id number] DOB: 1995/12/25  03/30/2019  [name] was observed post Covid-19 immunization for 15 minutes without incident. She was provided with Vaccine Information Sheet and instruction to access the V-Safe system.   [name] was instructed to call 911 with any severe reactions post vaccine: Marland Kitchen Difficulty breathing  . Swelling of face and throat  . A fast heartbeat  . A bad rash all over body  . Dizziness and weakness   Immunizations Administered    Name Date Dose VIS Date Route   Pfizer COVID-19 Vaccine 03/30/2019  8:36 AM 0.3 mL 12/13/2018 Intramuscular   Manufacturer: [employer], [employer]   Lot: [id number]   NDC: [address]-4

## 2019-04-09 ENCOUNTER — Ambulatory Visit (INDEPENDENT_AMBULATORY_CARE_PROVIDER_SITE_OTHER): Payer: BC Managed Care – PPO | Admitting: Psychology

## 2019-04-09 DIAGNOSIS — F410 Panic disorder [episodic paroxysmal anxiety] without agoraphobia: Secondary | ICD-10-CM | POA: Diagnosis not present

## 2019-04-23 ENCOUNTER — Ambulatory Visit (INDEPENDENT_AMBULATORY_CARE_PROVIDER_SITE_OTHER): Payer: BC Managed Care – PPO | Admitting: Psychology

## 2019-04-23 DIAGNOSIS — F410 Panic disorder [episodic paroxysmal anxiety] without agoraphobia: Secondary | ICD-10-CM

## 2019-05-07 ENCOUNTER — Ambulatory Visit (INDEPENDENT_AMBULATORY_CARE_PROVIDER_SITE_OTHER): Payer: BC Managed Care – PPO | Admitting: Psychology

## 2019-05-07 DIAGNOSIS — F410 Panic disorder [episodic paroxysmal anxiety] without agoraphobia: Secondary | ICD-10-CM | POA: Diagnosis not present

## 2019-05-21 ENCOUNTER — Ambulatory Visit: Payer: BC Managed Care – PPO | Admitting: Psychology

## 2019-06-04 ENCOUNTER — Ambulatory Visit (INDEPENDENT_AMBULATORY_CARE_PROVIDER_SITE_OTHER): Payer: BC Managed Care – PPO | Admitting: Psychology

## 2019-06-04 DIAGNOSIS — F410 Panic disorder [episodic paroxysmal anxiety] without agoraphobia: Secondary | ICD-10-CM

## 2019-06-18 ENCOUNTER — Ambulatory Visit: Payer: BC Managed Care – PPO | Admitting: Psychology

## 2019-07-02 ENCOUNTER — Ambulatory Visit: Payer: BC Managed Care – PPO | Admitting: Psychology

## 2019-07-16 ENCOUNTER — Ambulatory Visit (INDEPENDENT_AMBULATORY_CARE_PROVIDER_SITE_OTHER): Payer: BC Managed Care – PPO | Admitting: Psychology

## 2019-07-16 DIAGNOSIS — F410 Panic disorder [episodic paroxysmal anxiety] without agoraphobia: Secondary | ICD-10-CM | POA: Diagnosis not present

## 2019-07-30 ENCOUNTER — Ambulatory Visit (INDEPENDENT_AMBULATORY_CARE_PROVIDER_SITE_OTHER): Payer: BC Managed Care – PPO | Admitting: Psychology

## 2019-07-30 DIAGNOSIS — F410 Panic disorder [episodic paroxysmal anxiety] without agoraphobia: Secondary | ICD-10-CM

## 2019-08-13 ENCOUNTER — Ambulatory Visit (INDEPENDENT_AMBULATORY_CARE_PROVIDER_SITE_OTHER): Payer: BC Managed Care – PPO | Admitting: Psychology

## 2019-08-13 DIAGNOSIS — F410 Panic disorder [episodic paroxysmal anxiety] without agoraphobia: Secondary | ICD-10-CM | POA: Diagnosis not present

## 2019-09-10 ENCOUNTER — Ambulatory Visit: Payer: BC Managed Care – PPO | Admitting: Psychology

## 2019-09-24 ENCOUNTER — Ambulatory Visit (INDEPENDENT_AMBULATORY_CARE_PROVIDER_SITE_OTHER): Payer: BC Managed Care – PPO | Admitting: Psychology

## 2019-09-24 DIAGNOSIS — F410 Panic disorder [episodic paroxysmal anxiety] without agoraphobia: Secondary | ICD-10-CM | POA: Diagnosis not present

## 2019-10-04 ENCOUNTER — Encounter (INDEPENDENT_AMBULATORY_CARE_PROVIDER_SITE_OTHER): Payer: Self-pay

## 2019-10-08 ENCOUNTER — Ambulatory Visit (INDEPENDENT_AMBULATORY_CARE_PROVIDER_SITE_OTHER): Payer: BC Managed Care – PPO | Admitting: Psychology

## 2019-10-08 DIAGNOSIS — F410 Panic disorder [episodic paroxysmal anxiety] without agoraphobia: Secondary | ICD-10-CM

## 2019-10-22 ENCOUNTER — Ambulatory Visit (INDEPENDENT_AMBULATORY_CARE_PROVIDER_SITE_OTHER): Payer: BC Managed Care – PPO | Admitting: Psychology

## 2019-10-22 DIAGNOSIS — F410 Panic disorder [episodic paroxysmal anxiety] without agoraphobia: Secondary | ICD-10-CM

## 2019-10-30 ENCOUNTER — Telehealth: Payer: BC Managed Care – PPO | Admitting: Physician Assistant

## 2019-10-30 DIAGNOSIS — J069 Acute upper respiratory infection, unspecified: Secondary | ICD-10-CM

## 2019-10-30 NOTE — Progress Notes (Signed)
We are sorry that you are not feeling well.  Here is how we plan to help!  I have sent a note to your MyChart for 10/28 and 10/29.  Your symptoms indicate a likely viral infection (Pharyngitis).   Pharyngitis is inflammation in the back of the throat which can cause a sore throat, scratchiness and sometimes difficulty swallowing.   Pharyngitis is typically caused by a respiratory virus and will just run its course.  Please keep in mind that your symptoms could last up to 10 days.  For throat pain, we recommend over the counter oral pain relief medications such as acetaminophen or aspirin, or anti-inflammatory medications such as ibuprofen or naproxen sodium.  Topical treatments such as oral throat lozenges or sprays may be used as needed.  Avoid close contact with loved ones, especially the very young and elderly.  Remember to wash your hands thoroughly throughout the day as this is the number one way to prevent the spread of infection and wipe down door knobs and counters with disinfectant.  After careful review of your answers, I would not recommend and antibiotic for your condition.  Antibiotics should not be used to treat conditions that we suspect are caused by viruses like the virus that causes the common cold or flu. However, some people can have Strep with atypical symptoms. You may need formal testing in clinic or office to confirm if your symptoms continue or worsen.  Providers prescribe antibiotics to treat infections caused by bacteria. Antibiotics are very powerful in treating bacterial infections when they are used properly.  To maintain their effectiveness, they should be used only when necessary.  Overuse of antibiotics has resulted in the development of super bugs that are resistant to treatment!    Home Care:  Only take medications as instructed by your medical team.  Do not drink alcohol while taking these medications.  A steam or ultrasonic humidifier can help congestion.  You can  place a towel over your head and breathe in the steam from hot water coming from a faucet.  Avoid close contacts especially the very young and the elderly.  Cover your mouth when you cough or sneeze.  Always remember to wash your hands.  Get Help Right Away If:  You develop worsening fever or throat pain.  You develop a severe head ache or visual changes.  Your symptoms persist after you have completed your treatment plan.  Make sure you  Understand these instructions.  Will watch your condition.  Will get help right away if you are not doing well or get worse.  Your e-visit answers were reviewed by a board certified advanced clinical practitioner to complete your personal care plan.  Depending on the condition, your plan could have included both over the counter or prescription medications.  If there is a problem please reply  once you have received a response from your provider.  Your safety is important to [place].  If you have drug allergies check your prescription carefully.    You can use MyChart to ask questions about todays visit, request a non-urgent call back, or ask for a work or school excuse for 24 hours related to this e-Visit. If it has been greater than 24 hours you will need to follow up with your provider, or enter a new e-Visit to address those concerns.  You will get an e-mail in the next two days asking about your experience.  I hope that your e-visit has been valuable and will speed  your recovery. Thank you for using e-visits.  Greater than 5 minutes, yet less than 10 minutes of time have been spent researching, coordinating, and implementing care for this patient today

## 2019-11-05 ENCOUNTER — Ambulatory Visit: Payer: BC Managed Care – PPO | Admitting: Psychology

## 2019-11-07 ENCOUNTER — Ambulatory Visit (INDEPENDENT_AMBULATORY_CARE_PROVIDER_SITE_OTHER): Payer: BC Managed Care – PPO | Admitting: Psychology

## 2019-11-07 DIAGNOSIS — F410 Panic disorder [episodic paroxysmal anxiety] without agoraphobia: Secondary | ICD-10-CM

## 2019-11-13 ENCOUNTER — Ambulatory Visit: Payer: BC Managed Care – PPO | Admitting: Family Medicine

## 2019-11-13 ENCOUNTER — Other Ambulatory Visit: Payer: Self-pay

## 2019-11-13 ENCOUNTER — Encounter: Payer: Self-pay | Admitting: Family Medicine

## 2019-11-13 VITALS — BP 108/64 | HR 91 | Temp 97.1°F | Ht 63.75 in | Wt 187.4 lb

## 2019-11-13 DIAGNOSIS — Z0000 Encounter for general adult medical examination without abnormal findings: Secondary | ICD-10-CM | POA: Diagnosis not present

## 2019-11-13 DIAGNOSIS — F418 Other specified anxiety disorders: Secondary | ICD-10-CM

## 2019-11-13 DIAGNOSIS — E063 Autoimmune thyroiditis: Secondary | ICD-10-CM | POA: Diagnosis not present

## 2019-11-13 DIAGNOSIS — N946 Dysmenorrhea, unspecified: Secondary | ICD-10-CM

## 2019-11-13 DIAGNOSIS — E7800 Pure hypercholesterolemia, unspecified: Secondary | ICD-10-CM | POA: Diagnosis not present

## 2019-11-13 LAB — TSH: TSH: 1.6 u[IU]/mL (ref 0.35–4.50)

## 2019-11-13 LAB — CBC WITH DIFFERENTIAL/PLATELET
Basophils Absolute: 0.1 10*3/uL (ref 0.0–0.1)
Basophils Relative: 0.8 % (ref 0.0–3.0)
Eosinophils Absolute: 0 10*3/uL (ref 0.0–0.7)
Eosinophils Relative: 0.2 % (ref 0.0–5.0)
HCT: 40.9 % (ref 36.0–46.0)
Hemoglobin: 13.8 g/dL (ref 12.0–15.0)
Lymphocytes Relative: 28.7 % (ref 12.0–46.0)
Lymphs Abs: 2 10*3/uL (ref 0.7–4.0)
MCHC: 33.7 g/dL (ref 30.0–36.0)
MCV: 86.1 fl (ref 78.0–100.0)
Monocytes Absolute: 0.5 10*3/uL (ref 0.1–1.0)
Monocytes Relative: 7.3 % (ref 3.0–12.0)
Neutro Abs: 4.3 10*3/uL (ref 1.4–7.7)
Neutrophils Relative %: 63 % (ref 43.0–77.0)
Platelets: 306 10*3/uL (ref 150.0–400.0)
RBC: 4.75 Mil/uL (ref 3.87–5.11)
RDW: 13.3 % (ref 11.5–15.5)
WBC: 6.9 10*3/uL (ref 4.0–10.5)

## 2019-11-13 LAB — COMPREHENSIVE METABOLIC PANEL
ALT: 8 U/L (ref 0–35)
AST: 14 U/L (ref 0–37)
Albumin: 4.3 g/dL (ref 3.5–5.2)
Alkaline Phosphatase: 54 U/L (ref 39–117)
BUN: 13 mg/dL (ref 6–23)
CO2: 28 mEq/L (ref 19–32)
Calcium: 9.5 mg/dL (ref 8.4–10.5)
Chloride: 103 mEq/L (ref 96–112)
Creatinine, Ser: 0.64 mg/dL (ref 0.40–1.20)
GFR: 122.25 mL/min (ref >60.00)
Glucose, Bld: 82 mg/dL (ref 70–99)
Potassium: 4.4 mEq/L (ref 3.5–5.1)
Sodium: 137 mEq/L (ref 135–145)
Total Bilirubin: 0.4 mg/dL (ref 0.2–1.2)
Total Protein: 6.9 g/dL (ref 6.0–8.3)

## 2019-11-13 LAB — LIPID PANEL
Cholesterol: 212 mg/dL — ABNORMAL HIGH (ref 0–200)
HDL: 64 mg/dL (ref >39.00)
LDL Cholesterol: 117 mg/dL — ABNORMAL HIGH (ref 0–99)
NonHDL: 148.04
Total CHOL/HDL Ratio: 3
Triglycerides: 156 mg/dL — ABNORMAL HIGH (ref 0.0–149.0)
VLDL: 31.2 mg/dL (ref 0.0–40.0)

## 2019-11-13 MED ORDER — SERTRALINE HCL 100 MG PO TABS
100.0000 mg | ORAL_TABLET | Freq: Every day | ORAL | 3 refills | Status: DC
Start: 2019-11-13 — End: 2020-11-04

## 2019-11-13 MED ORDER — LEVOTHYROXINE SODIUM 25 MCG PO TABS
25.0000 ug | ORAL_TABLET | Freq: Every day | ORAL | 3 refills | Status: DC
Start: 2019-11-13 — End: 2020-11-02

## 2019-11-13 MED ORDER — BUSPIRONE HCL 15 MG PO TABS: 7.5000 mg | ORAL_TABLET | Freq: Two times a day (BID) | ORAL | 3 refills | Status: DC

## 2019-11-13 MED ORDER — LEVONORGEST-ETH ESTRAD 91-DAY 0.15-0.03 &0.01 MG PO TABS: 1.0000 | ORAL_TABLET | Freq: Every day | ORAL | 3 refills | Status: DC

## 2019-11-13 NOTE — Patient Instructions (Addendum)
For protein:  Beans , dairy, non dairy (soy milk, oat milk, almond milk), soy products (tofu)  Nuts and nut butter   Continue the generic zoloft  Add buspar 7.5 mg twice daily  Let me know if any side effects or problems and how you feel in about a month   Add some exercise   - gradually   We will plan a pap next time   Labs today

## 2019-11-13 NOTE — Assessment & Plan Note (Signed)
Reviewed health habits including diet and exercise and skin cancer prevention Reviewed appropriate screening tests for age  Also reviewed health mt list, fam hx and immunization status , as well as social and family history   See HPI Labs ordered  Plan made for stressors (inc PHQ score) and dep -continue counseling and adding medication  Declines hep C screen due to low risk Pap def today-menses now  utd imms , is covid immunized

## 2019-11-13 NOTE — Progress Notes (Signed)
Subjective:    Patient ID: Mary Crawford, female    DOB: Oct 08, 1995, 24 y.o.   MRN: [id number]  This visit occurred during the SARS-CoV-2 public health emergency.  Safety protocols were in place, including screening questions prior to the visit, additional usage of staff PPE, and extensive cleaning of exam room while observing appropriate contact time as indicated for disinfecting solutions.    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)  Lots of extra things and responsibilities Kids are out of control  Sees Synetta Fail for counseling every 2 weeks and this is helpful   Does breathing/tapping  Also meditating  No exercise right now -wants to fit that in  Comes home exhausted -needs to push herself to start and then she will be ok (yoga and walking)  Hep C screening -declines low risk   Pap 8/17 - gyn Menses currently  Periods have been much better  Contraception -OC (likes the one she is on)  She is interested in in the next 2 y   No hx of abn paps or HPV virus    Tdap 6/13 covid immunized pfizer 10/21 Flu shot 9/21  Hypothyroidism  (h/o Hashimotos) Due for labs  Levothyroxine 25 mcg daily  Goiter  Lab Results  Component Value Date   TSH 2.27 11/13/2018   Feels about the same    Depression with anxiety  Sertraline 100 mg  PHQ score is 17  Very high stress right now  Anxiety is worse   (due to pressure from work and herself) Panic attacks  Working with her counselor   Hyperlipidemia Lab Results  Component Value Date   CHOL 231 (H) 11/13/2018   HDL 60.70 11/13/2018   LDLCALC 142 (H) 11/13/2018   TRIG 142.0 11/13/2018   CHOLHDL  4 11/13/2018   Due for labs  Not eating meat  Some cheese  Not as many fried foods   Patient Active Problem List   Diagnosis Date Noted  . Hyperlipidemia 11/13/2018  . Depression with anxiety 04/20/2016  . Routine general medical examination at a health care facility 01/05/2016  . Pelvic pain in female 12/24/2014  . Dysmenorrhea 01/14/2014  . Migraine without aura 05/28/2013  . History of depression 02/10/2013  . Goiter   . History of Hashimoto thyroiditis   . Hypothyroidism, acquired, autoimmune   . Fatigue   . Orthostatic hypotension    Past Medical History:  Diagnosis Date  . Abdominal pain, unspecified site   . 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 Depression    history of  . Dyspepsia   . Fatigue   . Goiter   . Headache    Migraines  . Hypothyroidism, acquired, autoimmune   . Nausea with vomiting   . Orthostatic hypotension   . Pain in joint, pelvic region and thigh    right hip pain  . Pre-syncope 03/2008   cardiac referral for pre syncope 03/10  . Thyroiditis, autoimmune   . Thyrotoxicosis with Hashimoto thyroiditis   . Tobacco smoke exposure  positive hx of passive tobacco smoke exposure   Past Surgical History:  Procedure Laterality Date  . LAPAROSCOPY N/A 01/05/2015   Procedure: LAPAROSCOPY DIAGNOSTIC;  Surgeon: Reva Bores, MD;  Location: [facility];  Service: Gynecology;  Laterality: N/A;  . TONSILLECTOMY AND ADENOIDECTOMY  11/2001  . TONSILLECTOMY AND ADENOIDECTOMY     Social History   Tobacco Use  . Smoking status: Never Smoker  . Smokeless tobacco: Never Used  Substance Use Topics  . Alcohol use: Yes    Alcohol/week: 0.0 standard drinks    Comment: occassional  . Drug use: No   Family History  Problem Relation Age of Onset  . Migraines Mother   . Thyroid disease Mother   . Heart disease Maternal Grandmother   . Diabetes Maternal Grandfather   . Diabetes Paternal Grandfather   . Thyroid disease Paternal Grandfather      Allergies  Allergen Reactions  . Aleve [Naproxen] Hives  . Prochlorperazine Anxiety    restlessness  . Prochlorperazine Edisylate Anxiety   Current Outpatient Medications on File Prior to Visit  Medication Sig Dispense Refill  . acetaminophen (TYLENOL) 325 MG tablet Take 650 mg by mouth every 6 (six) hours as needed for mild pain or headache.     No current facility-administered medications on file prior to visit.    Review of Systems  Constitutional: Negative for activity change, appetite change, fatigue, fever and unexpected weight change.  HENT: Negative for congestion, ear pain, rhinorrhea, sinus pressure and sore throat.   Eyes: Negative for pain, redness and visual disturbance.  Respiratory: Negative for cough, shortness of breath and wheezing.   Cardiovascular: Negative for chest pain and palpitations.  Gastrointestinal: Negative for abdominal pain, blood in stool, constipation and diarrhea.  Endocrine: Negative for polydipsia and polyuria.  Genitourinary: Negative for dysuria, frequency and urgency.  Musculoskeletal: Negative for arthralgias, back pain and myalgias.  Skin: Negative for pallor and rash.  Allergic/Immunologic: Negative for environmental allergies.  Neurological: Negative for dizziness, syncope and headaches.  Hematological: Negative for adenopathy. Does not bruise/bleed easily.  Psychiatric/Behavioral: Positive for sleep disturbance. Negative for decreased concentration and dysphoric mood. The patient is nervous/anxious.        Objective:   Physical Exam Constitutional:      General: She is not in acute distress.    Appearance: Normal appearance. She is well-developed. She is obese. She is not ill-appearing or diaphoretic.  HENT:     Head: Normocephalic and atraumatic.     Right Ear: Tympanic membrane, ear canal and external ear normal.     Left Ear: Tympanic membrane, ear canal and external ear normal.     Nose: Nose normal. No congestion.      Mouth/Throat:     Mouth: Mucous membranes are moist.     Pharynx: Oropharynx is clear. No posterior oropharyngeal erythema.  Eyes:     General: No scleral icterus.    Extraocular Movements: Extraocular movements intact.     Conjunctiva/sclera: Conjunctivae normal.     Pupils: Pupils are equal, round, and reactive to light.  Neck:     Thyroid: No thyromegaly.     Vascular: No carotid bruit or JVD.     Comments: No changes in thyroid exam-goiter Cardiovascular:     Rate and Rhythm: Normal rate and regular rhythm.     Pulses: Normal pulses.     Heart sounds: Normal heart sounds. No gallop.   Pulmonary:     Effort: Pulmonary effort is normal. No respiratory  distress.     Breath sounds: Normal breath sounds. No wheezing.     Comments: Good air exch Chest:     Chest wall: No tenderness.  Abdominal:     General: Bowel sounds are normal. There is no distension or abdominal bruit.     Palpations: Abdomen is soft. There is no mass.     Tenderness: There is no abdominal tenderness.     Hernia: No hernia is present.  Genitourinary:    Comments: Breast exam: No mass, nodules, thickening, tenderness, bulging, retraction, inflamation, nipple discharge or skin changes noted.  No axillary or clavicular LA.     Musculoskeletal:        General: No tenderness. Normal range of motion.     Cervical back: Normal range of motion and neck supple. No rigidity. No muscular tenderness.     Right lower leg: No edema.     Left lower leg: No edema.  Lymphadenopathy:     Cervical: No cervical adenopathy.  Skin:    General: Skin is warm and dry.     Coloration: Skin is not pale.     Findings: No erythema or rash.     Comments: Solar lentigines diffusely   Neurological:     Mental Status: She is alert. Mental status is at baseline.     Cranial Nerves: No cranial nerve deficit.     Motor: No abnormal muscle tone.     Coordination: Coordination normal.     Gait: Gait normal.     Deep Tendon Reflexes:  Reflexes are normal and symmetric. Reflexes normal.     Comments: No termor   Psychiatric:        Mood and Affect: Mood normal.        Cognition and Memory: Cognition and memory normal.           Assessment & Plan:   Problem List Items Addressed This Visit      Endocrine   Hypothyroidism, acquired, autoimmune    Due for TSH No clinical changes  No change in exam /goiter  Lab today      Relevant Medications   levothyroxine (SYNTHROID) 25 MCG tablet   Other Relevant Orders   TSH     Genitourinary   Dysmenorrhea    Well controlled with oral contraceptive  Periods are tolerable  May consider pregnancy in a few years  Menses today- def pap      Relevant Medications   Levonorgestrel-Ethinyl Estradiol (AMETHIA) 0.15-0.03 &0.01 MG tablet     Other   Routine general medical examination at a health care facility - Primary    Reviewed health habits including diet and exercise and skin cancer prevention Reviewed appropriate screening tests for age  Also reviewed health mt list, fam hx and immunization status , as well as social and family history   See HPI Labs ordered  Plan made for stressors (inc PHQ score) and dep -continue counseling and adding medication  Declines hep C screen due to low risk Pap def today-menses now  utd imms , is covid immunized         Relevant Orders   CBC with Differential/Platelet   Comprehensive metabolic panel   Lipid panel   TSH   Depression with anxiety    Worse anxiety in the face of stress  Reviewed stressors/ coping techniques/symptoms/ support sources/ tx options and side effects in detail today  Works as a Runner, 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  Enc her to continue counseling with Synetta FailAnita  Also enc self care/add exercise  Will continue sertraline 100 mg daily  Add buspar 7.5 mg bid Discussed expectations of this medication including time to effectiveness and mechanism of action, also poss of side effects (early and late)- including mental  fuzziness, weight or appetite change, nausea and poss of worse dep or anxiety (even suicidal thoughts)  Pt voiced understanding and will stop med and update if this occurs   Will update and can titrate if needed      Relevant Medications   busPIRone (BUSPAR) 15 MG tablet   sertraline (ZOLOFT) 100 MG tablet   Hyperlipidemia    Disc goals for lipids and reasons to control them Rev last labs with pt Rev low sat fat diet in detail Labs today  Pt is now vegetarian-hope to see improvement       Relevant Orders   Lipid panel

## 2019-11-13 NOTE — Assessment & Plan Note (Signed)
Disc goals for lipids and reasons to control them Rev last labs with pt Rev low sat fat diet in detail Labs today  Pt is now vegetarian-hope to see improvement

## 2019-11-13 NOTE — Assessment & Plan Note (Signed)
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

## 2019-11-13 NOTE — Assessment & Plan Note (Signed)
Well controlled with oral contraceptive  Periods are tolerable  May consider pregnancy in a few years  Menses today- def pap

## 2019-11-13 NOTE — Assessment & Plan Note (Signed)
Due for TSH No clinical changes  No change in exam /goiter  Lab today

## 2019-11-14 ENCOUNTER — Encounter: Payer: Self-pay | Admitting: *Deleted

## 2019-11-19 ENCOUNTER — Ambulatory Visit: Payer: BC Managed Care – PPO | Admitting: Psychology

## 2019-11-21 ENCOUNTER — Ambulatory Visit (INDEPENDENT_AMBULATORY_CARE_PROVIDER_SITE_OTHER): Payer: BC Managed Care – PPO | Admitting: Psychology

## 2019-11-21 DIAGNOSIS — F410 Panic disorder [episodic paroxysmal anxiety] without agoraphobia: Secondary | ICD-10-CM

## 2019-12-03 ENCOUNTER — Ambulatory Visit (INDEPENDENT_AMBULATORY_CARE_PROVIDER_SITE_OTHER): Payer: BC Managed Care – PPO | Admitting: Psychology

## 2019-12-03 DIAGNOSIS — F410 Panic disorder [episodic paroxysmal anxiety] without agoraphobia: Secondary | ICD-10-CM

## 2019-12-17 ENCOUNTER — Ambulatory Visit: Payer: BC Managed Care – PPO | Admitting: Psychology

## 2019-12-31 ENCOUNTER — Ambulatory Visit (INDEPENDENT_AMBULATORY_CARE_PROVIDER_SITE_OTHER): Payer: BC Managed Care – PPO | Admitting: Psychology

## 2019-12-31 DIAGNOSIS — F410 Panic disorder [episodic paroxysmal anxiety] without agoraphobia: Secondary | ICD-10-CM | POA: Diagnosis not present

## 2020-01-14 ENCOUNTER — Ambulatory Visit (INDEPENDENT_AMBULATORY_CARE_PROVIDER_SITE_OTHER): Payer: BC Managed Care – PPO | Admitting: Psychology

## 2020-01-14 DIAGNOSIS — F410 Panic disorder [episodic paroxysmal anxiety] without agoraphobia: Secondary | ICD-10-CM

## 2020-01-28 ENCOUNTER — Ambulatory Visit (INDEPENDENT_AMBULATORY_CARE_PROVIDER_SITE_OTHER): Payer: BC Managed Care – PPO | Admitting: Psychology

## 2020-01-28 DIAGNOSIS — F410 Panic disorder [episodic paroxysmal anxiety] without agoraphobia: Secondary | ICD-10-CM | POA: Diagnosis not present

## 2020-02-24 ENCOUNTER — Telehealth: Payer: BC Managed Care – PPO | Admitting: Physician Assistant

## 2020-02-24 DIAGNOSIS — J069 Acute upper respiratory infection, unspecified: Secondary | ICD-10-CM

## 2020-02-24 NOTE — Progress Notes (Signed)
Based on what you shared with me, I feel your condition warrants further evaluation and I recommend that you be seen for a face to face office visit. Neck stiffness can be a sign of a serious infection.  Additional testing may be necessary after a full physical exam is performed.   NOTE: If you entered your credit card information for this eVisit, you will not be charged. You may see a "hold" on your card for the $35 but that hold will drop off and you will not have a charge processed.   If you are having a true medical emergency please call 911.      For an urgent face to face visit, [place] has five urgent care centers for your convenience:     [facility] at [facility] Directions [phone] [address] [place], [place] [address] . 10 am - 6pm Monday - Friday    [facility]gent [facility]) Get Driving Directions [phone] [address] [place], [place] [address] . 10 am to 8 pm Monday-Friday . 12 pm to 8 pm [facility] Urgent Care at [facility] Get Driving Directions [phone] [address] [place], [place] [address] . 8 am to 8 pm Monday-Friday . 9 am to 6 pm Saturday . 11 am to 6 pm Sunday     [facility] Health Urgent Care at [facility] Get Driving Directions  [phone] [address] [place], [place] [address] . 8 am to 8 pm Monday-Friday . 8 am to 4 pm [facility] Urgent Care at [facility] Directions [phone] [address] [place], [place] [address] . 12 pm to 6 pm Monday-Friday      Your e-visit answers were reviewed by a board certified advanced clinical practitioner to complete your personal care plan.  Thank you for using e-Visits.    Greater than 5 minutes, yet less than 10 minutes of time have been spent researching, coordinating, and implementing care for this patient today

## 2020-02-25 ENCOUNTER — Ambulatory Visit (INDEPENDENT_AMBULATORY_CARE_PROVIDER_SITE_OTHER): Payer: BC Managed Care – PPO | Admitting: Psychology

## 2020-02-25 DIAGNOSIS — F410 Panic disorder [episodic paroxysmal anxiety] without agoraphobia: Secondary | ICD-10-CM | POA: Diagnosis not present

## 2020-03-10 ENCOUNTER — Ambulatory Visit (INDEPENDENT_AMBULATORY_CARE_PROVIDER_SITE_OTHER): Payer: BC Managed Care – PPO | Admitting: Psychology

## 2020-03-10 DIAGNOSIS — F410 Panic disorder [episodic paroxysmal anxiety] without agoraphobia: Secondary | ICD-10-CM | POA: Diagnosis not present

## 2020-03-24 ENCOUNTER — Ambulatory Visit (INDEPENDENT_AMBULATORY_CARE_PROVIDER_SITE_OTHER): Payer: BC Managed Care – PPO | Admitting: Psychology

## 2020-03-24 DIAGNOSIS — F410 Panic disorder [episodic paroxysmal anxiety] without agoraphobia: Secondary | ICD-10-CM

## 2020-04-05 ENCOUNTER — Telehealth: Payer: BC Managed Care – PPO | Admitting: Physician Assistant

## 2020-04-05 DIAGNOSIS — J302 Other seasonal allergic rhinitis: Secondary | ICD-10-CM

## 2020-04-05 NOTE — Progress Notes (Signed)
E visit for Allergic Rhinitis We are sorry that you are not feeling well.  Here is how we plan to help!  Based on what you have shared with me it looks like you have Allergic Rhinitis.  Rhinitis is when a reaction occurs that causes nasal congestion, runny nose, sneezing, and itching.  Most types of rhinitis are caused by an inflammation and are associated with symptoms in the eyes ears or throat. There are several types of rhinitis.  The most common are acute rhinitis, which is usually caused by a viral illness, allergic or seasonal rhinitis, and nonallergic or year-round rhinitis.  Nasal allergies occur certain times of the year.  Allergic rhinitis is caused when allergens in the air trigger the release of histamine in the body.  Histamine causes itching, swelling, and fluid to build up in the fragile linings of the nasal passages, sinuses and eyelids.  An itchy nose and clear discharge are common.  I recommend the following over the counter treatments: You should take a daily dose of antihistamine and Xyzal 5 mg take 1 tablet daily  I also would recommend a nasal spray: Flonase 2 sprays into each nostril once daily  These can be obtained over-the-counter.   I have sent your note to your MyChart.   HOME CARE:   You can use an over-the-counter saline nasal spray as needed  Avoid areas where there is heavy dust, mites, or molds  Stay indoors on windy days during the pollen season  Keep windows closed in home, at least in bedroom; use air conditioner.  Use high-efficiency house air filter  Keep windows closed in car, turn AC on re-circulate  Avoid playing out with dog during pollen season  GET HELP RIGHT AWAY IF:   If your symptoms do not improve within 10 days  You become short of breath  You develop yellow or green discharge from your nose for over 3 days  You have coughing fits  MAKE SURE YOU:   Understand these instructions  Will watch your condition  Will get  help right away if you are not doing well or get worse  Thank you for choosing an e-visit. Your e-visit answers were reviewed by a board certified advanced clinical practitioner to complete your personal care plan. Depending upon the condition, your plan could have included both over the counter or prescription medications. Please review your pharmacy choice. Be sure that the pharmacy you have chosen is open so that you can pick up your prescription now.  If there is a problem you may message your provider in MyChart to have the prescription routed to another pharmacy. Your safety is important to [place]. If you have drug allergies check your prescription carefully.  For the next 24 hours, you can use MyChart to ask questions about today's visit, request a non-urgent call back, or ask for a work or school excuse from your e-visit provider. You will get an email in the next two days asking about your experience. I hope that your e-visit has been valuable and will speed your recovery.

## 2020-04-05 NOTE — Progress Notes (Signed)
I have spent 5 minutes in review of e-visit questionnaire, review and updating patient chart, medical decision making and response to patient.   Khayla Koppenhaver Cody Coy [place], PA-C    

## 2020-04-07 ENCOUNTER — Ambulatory Visit (INDEPENDENT_AMBULATORY_CARE_PROVIDER_SITE_OTHER): Payer: BC Managed Care – PPO | Admitting: Psychology

## 2020-04-07 DIAGNOSIS — F410 Panic disorder [episodic paroxysmal anxiety] without agoraphobia: Secondary | ICD-10-CM

## 2020-04-21 ENCOUNTER — Ambulatory Visit (INDEPENDENT_AMBULATORY_CARE_PROVIDER_SITE_OTHER): Payer: BC Managed Care – PPO | Admitting: Psychology

## 2020-04-21 DIAGNOSIS — F410 Panic disorder [episodic paroxysmal anxiety] without agoraphobia: Secondary | ICD-10-CM | POA: Diagnosis not present

## 2020-05-05 ENCOUNTER — Ambulatory Visit: Payer: BC Managed Care – PPO | Admitting: Psychology

## 2020-05-05 ENCOUNTER — Telehealth: Payer: BC Managed Care – PPO | Admitting: Nurse Practitioner

## 2020-05-05 DIAGNOSIS — J329 Chronic sinusitis, unspecified: Secondary | ICD-10-CM

## 2020-05-05 DIAGNOSIS — B9789 Other viral agents as the cause of diseases classified elsewhere: Secondary | ICD-10-CM

## 2020-05-05 MED ORDER — FLUTICASONE PROPIONATE 50 MCG/ACT NA SUSP: 2.0000 | Freq: Every day | NASAL | 6 refills | Status: DC

## 2020-05-05 NOTE — Progress Notes (Signed)

## 2020-05-19 ENCOUNTER — Ambulatory Visit (INDEPENDENT_AMBULATORY_CARE_PROVIDER_SITE_OTHER): Payer: BC Managed Care – PPO | Admitting: Psychology

## 2020-05-19 DIAGNOSIS — F410 Panic disorder [episodic paroxysmal anxiety] without agoraphobia: Secondary | ICD-10-CM | POA: Diagnosis not present

## 2020-06-02 ENCOUNTER — Ambulatory Visit (INDEPENDENT_AMBULATORY_CARE_PROVIDER_SITE_OTHER): Payer: BC Managed Care – PPO | Admitting: Psychology

## 2020-06-02 DIAGNOSIS — F410 Panic disorder [episodic paroxysmal anxiety] without agoraphobia: Secondary | ICD-10-CM | POA: Diagnosis not present

## 2020-06-16 ENCOUNTER — Ambulatory Visit: Payer: BC Managed Care – PPO | Admitting: Psychology

## 2020-06-30 ENCOUNTER — Ambulatory Visit (INDEPENDENT_AMBULATORY_CARE_PROVIDER_SITE_OTHER): Payer: BC Managed Care – PPO | Admitting: Psychology

## 2020-06-30 DIAGNOSIS — F410 Panic disorder [episodic paroxysmal anxiety] without agoraphobia: Secondary | ICD-10-CM | POA: Diagnosis not present

## 2020-07-14 ENCOUNTER — Ambulatory Visit (INDEPENDENT_AMBULATORY_CARE_PROVIDER_SITE_OTHER): Payer: BC Managed Care – PPO | Admitting: Psychology

## 2020-07-14 DIAGNOSIS — F410 Panic disorder [episodic paroxysmal anxiety] without agoraphobia: Secondary | ICD-10-CM | POA: Diagnosis not present

## 2020-07-28 ENCOUNTER — Ambulatory Visit (INDEPENDENT_AMBULATORY_CARE_PROVIDER_SITE_OTHER): Payer: BC Managed Care – PPO | Admitting: Psychology

## 2020-07-28 DIAGNOSIS — F410 Panic disorder [episodic paroxysmal anxiety] without agoraphobia: Secondary | ICD-10-CM | POA: Diagnosis not present

## 2020-08-11 ENCOUNTER — Ambulatory Visit (INDEPENDENT_AMBULATORY_CARE_PROVIDER_SITE_OTHER): Payer: BC Managed Care – PPO | Admitting: Psychology

## 2020-08-11 DIAGNOSIS — F410 Panic disorder [episodic paroxysmal anxiety] without agoraphobia: Secondary | ICD-10-CM | POA: Diagnosis not present

## 2020-08-24 ENCOUNTER — Ambulatory Visit (INDEPENDENT_AMBULATORY_CARE_PROVIDER_SITE_OTHER): Payer: BC Managed Care – PPO | Admitting: Psychology

## 2020-08-24 ENCOUNTER — Telehealth: Payer: BC Managed Care – PPO | Admitting: Physician Assistant

## 2020-08-24 DIAGNOSIS — M549 Dorsalgia, unspecified: Secondary | ICD-10-CM

## 2020-08-24 DIAGNOSIS — F410 Panic disorder [episodic paroxysmal anxiety] without agoraphobia: Secondary | ICD-10-CM | POA: Diagnosis not present

## 2020-08-24 DIAGNOSIS — J329 Chronic sinusitis, unspecified: Secondary | ICD-10-CM | POA: Insufficient documentation

## 2020-08-24 MED ORDER — CYCLOBENZAPRINE HCL 10 MG PO TABS: 10.0000 mg | ORAL_TABLET | Freq: Three times a day (TID) | ORAL | 0 refills | Status: DC | PRN

## 2020-08-24 NOTE — Progress Notes (Signed)

## 2020-08-25 ENCOUNTER — Ambulatory Visit: Payer: BC Managed Care – PPO | Admitting: Psychology

## 2020-09-14 ENCOUNTER — Telehealth: Payer: BC Managed Care – PPO | Admitting: Physician Assistant

## 2020-09-14 DIAGNOSIS — J029 Acute pharyngitis, unspecified: Secondary | ICD-10-CM

## 2020-09-14 NOTE — Progress Notes (Signed)
I have spent 5 minutes in review of e-visit questionnaire, review and updating patient chart, medical decision making and response to patient.   Unnamed Zeien Cody Andra Matsuo, PA-C    

## 2020-09-14 NOTE — Progress Notes (Signed)
E-Visit for Sore Throat  We are sorry that you are not feeling well.  Here is how we plan to help!  Your symptoms indicate a likely viral infection (Pharyngitis).   Pharyngitis is inflammation in the back of the throat which can cause a sore throat, scratchiness and sometimes difficulty swallowing.   Pharyngitis is typically caused by a respiratory virus and will just run its course.  Please keep in mind that your symptoms could last up to 10 days.  For throat pain, we recommend over the counter oral pain relief medications such as acetaminophen or aspirin, or anti-inflammatory medications such as ibuprofen or naproxen sodium.  Topical treatments such as oral throat lozenges or sprays may be used as needed.  Avoid close contact with loved ones, especially the very young and elderly.  Remember to wash your hands thoroughly throughout the day as this is the number one way to prevent the spread of infection and wipe down door knobs and counters with disinfectant.  After careful review of your answers, I would not recommend and antibiotic for your condition.  Antibiotics should not be used to treat conditions that we suspect are caused by viruses like the virus that causes the common cold or flu. However, some people can have Strep with atypical symptoms. You may need formal testing in clinic or office to confirm if your symptoms continue or worsen.  Providers prescribe antibiotics to treat infections caused by bacteria. Antibiotics are very powerful in treating bacterial infections when they are used properly.  To maintain their effectiveness, they should be used only when necessary.  Overuse of antibiotics has resulted in the development of super bugs that are resistant to treatment!    I do actually recommend taking a repeat COVID test 24 hours from last test, or go to local pharmacy for a PCR test -- giving symptoms there is concern you had a false-negative initial test.  I have made sure to send a  work note to your MyChart.   Home Care: Only take medications as instructed by your medical team. Do not drink alcohol while taking these medications. A steam or ultrasonic humidifier can help congestion.  You can place a towel over your head and breathe in the steam from hot water coming from a faucet. Avoid close contacts especially the very young and the elderly. Cover your mouth when you cough or sneeze. Always remember to wash your hands.  Get Help Right Away If: You develop worsening fever or throat pain. You develop a severe head ache or visual changes. Your symptoms persist after you have completed your treatment plan.  Make sure you Understand these instructions. Will watch your condition. Will get help right away if you are not doing well or get worse.   Thank you for choosing an e-visit.  Your e-visit answers were reviewed by a board certified advanced clinical practitioner to complete your personal care plan. Depending upon the condition, your plan could have included both over the counter or prescription medications.  Please review your pharmacy choice. Make sure the pharmacy is open so you can pick up prescription now. If there is a problem, you may contact your provider through [employer] and have the prescription routed to another pharmacy.  Your safety is important to [place]. If you have drug allergies check your prescription carefully.   For the next 24 hours you can use MyChart to ask questions about today's visit, request a non-urgent call back, or ask for a work or  or school excuse. You will get an email in the next two days asking about your experience. I hope that your e-visit has been valuable and will speed your recovery.  

## 2020-09-30 ENCOUNTER — Encounter: Payer: Self-pay | Admitting: Family Medicine

## 2020-09-30 ENCOUNTER — Other Ambulatory Visit: Payer: Self-pay

## 2020-09-30 ENCOUNTER — Telehealth (INDEPENDENT_AMBULATORY_CARE_PROVIDER_SITE_OTHER): Payer: BC Managed Care – PPO | Admitting: Family Medicine

## 2020-09-30 DIAGNOSIS — U071 COVID-19: Secondary | ICD-10-CM | POA: Diagnosis not present

## 2020-09-30 MED ORDER — BENZONATATE 200 MG PO CAPS: 200.0000 mg | ORAL_CAPSULE | Freq: Three times a day (TID) | ORAL | 1 refills | Status: DC | PRN

## 2020-09-30 NOTE — Patient Instructions (Signed)
Drink fluids and rest  mucinex DM is good for cough and congestion  Nasal saline for congestion as needed  Tylenol for fever or pain or headache  Please alert [place] if symptoms worsen (if severe or short of breath please go to the ER)   I sent in tessalon to try for additional cough control   Work letter went through [employer] to isolate until symptoms are better

## 2020-09-30 NOTE — Progress Notes (Signed)
Virtual Visit via Video Note  I connected with Mary Crawford on 09/30/20 at  8:00 AM EDT by a video enabled telemedicine application and verified that I am speaking with the correct person using two identifiers.  Location: Patient: home Provider: office   I discussed the limitations of evaluation and management by telemedicine and the availability of in person appointments. The patient expressed understanding and agreed to proceed.  Parties involved in encounter  Patient: Mary Crawford  Provider:  Roxy Manns MD   History of Present Illness: Pt presents with covid 19 positive test  She is a Runner, broadcasting/film/video with exposures   Felt poorly on Monday  By evening -chills and sweats  She took a home covid test and it was positive   She has a headache ST-not as bad as it was  Cough -is dry / occ little bit of clear mucous occasionally No wheezing  A little sob - when coughing/walking around  Upset stomach- a little nausea, no diarrhea or vomiting  Taste/smell - good  Some body aches  Some runny/stuffy nose Some ear discomfort No fever right now  Still has chills and sweats  Otc Tylenol -helps some  She took some allergy medicine initially  Flonase ns    Covid status: immunized   no POTS symptoms   Patient Active Problem List   Diagnosis Date Noted   COVID-19 09/30/2020   Sinusitis 08/24/2020   Hyperlipidemia 11/13/2018   Depression with anxiety 04/20/2016   Routine general medical examination at a health care facility 01/05/2016   Pelvic pain in female 12/24/2014   Dysmenorrhea 01/14/2014   Migraine without aura 05/28/2013   History of depression 02/10/2013   Goiter    History of Hashimoto thyroiditis    Hypothyroidism, acquired, autoimmune    Fatigue    Orthostatic hypotension    Past Medical History:  Diagnosis Date   Abdominal pain, unspecified site    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    Depression    history of   Dyspepsia    Fatigue     Goiter    Headache    Migraines   Hypothyroidism, acquired, autoimmune    Nausea with vomiting    Orthostatic hypotension    Pain in joint, pelvic region and thigh    right hip pain   Pre-syncope 03/2008   cardiac referral for pre syncope 03/10   Thyroiditis, autoimmune    Thyrotoxicosis with Hashimoto thyroiditis    Tobacco smoke exposure    positive hx of passive tobacco smoke exposure   Past Surgical History:  Procedure Laterality Date   LAPAROSCOPY N/A 01/05/2015   Procedure: LAPAROSCOPY DIAGNOSTIC;  Surgeon: Reva Bores, MD;  Location: [facility];  Service: Gynecology;  Laterality: N/A;   TONSILLECTOMY AND ADENOIDECTOMY  11/2001   TONSILLECTOMY AND ADENOIDECTOMY     Social History   Tobacco Use   Smoking status: Never   Smokeless tobacco: Never  Substance Use Topics   Alcohol use: Yes    Alcohol/week: 0.0 standard drinks    Comment: occassional   Drug use: No   Family History  Problem Relation Age of Onset   Migraines Mother    Thyroid disease Mother    Heart disease Maternal Grandmother    Diabetes Maternal Grandfather    Diabetes Paternal Grandfather    Thyroid disease Paternal Grandfather    Allergies  Allergen Reactions   Aleve [Naproxen] Hives   Prochlorperazine Anxiety    restlessness   Prochlorperazine  Edisylate Anxiety   Current Outpatient Medications on File Prior to Visit  Medication Sig Dispense Refill   acetaminophen (TYLENOL) 325 MG tablet Take 650 mg by mouth every 6 (six) hours as needed for mild pain or headache.     busPIRone (BUSPAR) 15 MG tablet Take 0.5 tablets (7.5 mg total) by mouth 2 (two) times daily. 90 tablet 3   cyclobenzaprine (FLEXERIL) 10 MG tablet Take 1 tablet (10 mg total) by mouth 3 (three) times daily as needed for muscle spasms. 30 tablet 0   fluticasone (FLONASE) 50 MCG/ACT nasal spray Place 2 sprays into both nostrils daily. 16 g 6   Levonorgestrel-Ethinyl Estradiol (AMETHIA) 0.15-0.03 &0.01 MG tablet Take 1 tablet by mouth  daily. 90 tablet 3   levothyroxine (SYNTHROID) 25 MCG tablet Take 1 tablet (25 mcg total) by mouth daily. 90 tablet 3   sertraline (ZOLOFT) 100 MG tablet Take 1 tablet (100 mg total) by mouth daily. 90 tablet 3   No current facility-administered medications on file prior to visit.   Review of Systems  Constitutional:  Positive for chills. Negative for fever and malaise/fatigue.  HENT:  Positive for congestion and sore throat. Negative for ear pain and sinus pain.   Eyes:  Negative for blurred vision, discharge and redness.  Respiratory:  Positive for cough. Negative for sputum production, shortness of breath, wheezing and stridor.   Cardiovascular:  Negative for chest pain, palpitations and leg swelling.  Gastrointestinal:  Negative for abdominal pain, diarrhea, nausea and vomiting.  Musculoskeletal:  Negative for myalgias.  Skin:  Negative for rash.  Neurological:  Positive for headaches. Negative for dizziness.   Observations/Objective: Patient appears well, in no distress Weight is baseline  No facial swelling or asymmetry Mildly hoarse voice No obvious tremor or mobility impairment Moving neck and UEs normally Able to hear the call well  No shortness of breath during interview , occ dry cough Talkative and mentally sharp with no cognitive changes No skin changes on face or neck , no rash or pallor Affect is normal    Assessment and Plan: Problem List Items Addressed This Visit       Other   COVID-19    Mild to moderate symptoms in healthy patient  Discussed symptom care Tessalon sent to pharmacy for cough relief  Discussed mucinex DM and tylenol ER precautions discussed Will watch for sob, severe headache, fever  Work letter done         Follow Up Instructions: Drink fluids and rest  mucinex DM is good for cough and congestion  Nasal saline for congestion as needed  Tylenol for fever or pain or headache  Please alert [place] if symptoms worsen (if severe or short  of breath please go to the ER)   I sent in tessalon to try for additional cough control   Work letter went through [employer] to isolate until symptoms are better   I discussed the assessment and treatment plan with the patient. The patient was provided an opportunity to ask questions and all were answered. The patient agreed with the plan and demonstrated an understanding of the instructions.   The patient was advised to call back or seek an in-person evaluation if the symptoms worsen or if the condition fails to improve as anticipated.     Roxy Manns, MD

## 2020-10-01 NOTE — Assessment & Plan Note (Signed)
Mild to moderate symptoms in healthy patient  Discussed symptom care Tessalon sent to pharmacy for cough relief  Discussed mucinex DM and tylenol ER precautions discussed Will watch for sob, severe headache, fever  Work letter done

## 2020-10-31 ENCOUNTER — Other Ambulatory Visit: Payer: Self-pay | Admitting: Family Medicine

## 2020-11-01 NOTE — Telephone Encounter (Signed)
Called patient and lvm

## 2020-11-01 NOTE — Telephone Encounter (Signed)
Pt is due for a CPE or at least a med refill f/u appt on or after 11/12/20, please schedule appt then route back to me to refill meds

## 2020-11-02 NOTE — Telephone Encounter (Signed)
Pt scheduled appt for 11/11 via mychart

## 2020-11-04 ENCOUNTER — Other Ambulatory Visit: Payer: Self-pay | Admitting: Family Medicine

## 2020-11-12 ENCOUNTER — Ambulatory Visit: Payer: BC Managed Care – PPO | Admitting: Family Medicine

## 2020-11-12 ENCOUNTER — Other Ambulatory Visit: Payer: Self-pay

## 2020-11-12 ENCOUNTER — Other Ambulatory Visit (HOSPITAL_COMMUNITY)
Admission: RE | Admit: 2020-11-12 | Discharge: 2020-11-12 | Disposition: A | Discharge status: Home / Self Care | Payer: BC Managed Care – PPO | Source: Ambulatory Visit | Attending: Family Medicine | Admitting: Family Medicine

## 2020-11-12 ENCOUNTER — Encounter: Payer: Self-pay | Admitting: Family Medicine

## 2020-11-12 VITALS — BP 114/68 | HR 99 | Temp 98.3°F | Ht 64.5 in | Wt 194.1 lb

## 2020-11-12 DIAGNOSIS — E7800 Pure hypercholesterolemia, unspecified: Secondary | ICD-10-CM | POA: Diagnosis not present

## 2020-11-12 DIAGNOSIS — E049 Nontoxic goiter, unspecified: Secondary | ICD-10-CM | POA: Diagnosis not present

## 2020-11-12 DIAGNOSIS — F418 Other specified anxiety disorders: Secondary | ICD-10-CM | POA: Diagnosis not present

## 2020-11-12 DIAGNOSIS — Z01419 Encounter for gynecological examination (general) (routine) without abnormal findings: Secondary | ICD-10-CM | POA: Diagnosis not present

## 2020-11-12 DIAGNOSIS — Z0000 Encounter for general adult medical examination without abnormal findings: Secondary | ICD-10-CM | POA: Diagnosis not present

## 2020-11-12 DIAGNOSIS — N946 Dysmenorrhea, unspecified: Secondary | ICD-10-CM

## 2020-11-12 DIAGNOSIS — E063 Autoimmune thyroiditis: Secondary | ICD-10-CM

## 2020-11-12 DIAGNOSIS — Z23 Encounter for immunization: Secondary | ICD-10-CM | POA: Diagnosis not present

## 2020-11-12 LAB — CBC WITH DIFFERENTIAL/PLATELET
Basophils Absolute: 0 10*3/uL (ref 0.0–0.1)
Basophils Relative: 0.8 % (ref 0.0–3.0)
Eosinophils Absolute: 0 10*3/uL (ref 0.0–0.7)
Eosinophils Relative: 0.2 % (ref 0.0–5.0)
HCT: 39.7 % (ref 36.0–46.0)
Hemoglobin: 13.4 g/dL (ref 12.0–15.0)
Lymphocytes Relative: 28.8 % (ref 12.0–46.0)
Lymphs Abs: 1.7 10*3/uL (ref 0.7–4.0)
MCHC: 33.9 g/dL (ref 30.0–36.0)
MCV: 84.6 fl (ref 78.0–100.0)
Monocytes Absolute: 0.5 10*3/uL (ref 0.1–1.0)
Monocytes Relative: 7.6 % (ref 3.0–12.0)
Neutro Abs: 3.8 10*3/uL (ref 1.4–7.7)
Neutrophils Relative %: 62.6 % (ref 43.0–77.0)
Platelets: 308 10*3/uL (ref 150.0–400.0)
RBC: 4.69 Mil/uL (ref 3.87–5.11)
RDW: 13.8 % (ref 11.5–15.5)
WBC: 6 10*3/uL (ref 4.0–10.5)

## 2020-11-12 LAB — LIPID PANEL
Cholesterol: 244 mg/dL — ABNORMAL HIGH (ref 0–200)
HDL: 55.1 mg/dL (ref >39.00)
LDL Cholesterol: 161 mg/dL — ABNORMAL HIGH (ref 0–99)
NonHDL: 188.7
Total CHOL/HDL Ratio: 4
Triglycerides: 139 mg/dL (ref 0.0–149.0)
VLDL: 27.8 mg/dL (ref 0.0–40.0)

## 2020-11-12 LAB — COMPREHENSIVE METABOLIC PANEL
ALT: 10 U/L (ref 0–35)
AST: 16 U/L (ref 0–37)
Albumin: 4.5 g/dL (ref 3.5–5.2)
Alkaline Phosphatase: 47 U/L (ref 39–117)
BUN: 11 mg/dL (ref 6–23)
CO2: 25 mEq/L (ref 19–32)
Calcium: 9.5 mg/dL (ref 8.4–10.5)
Chloride: 102 mEq/L (ref 96–112)
Creatinine, Ser: 0.77 mg/dL (ref 0.40–1.20)
GFR: 105.96 mL/min (ref >60.00)
Glucose, Bld: 75 mg/dL (ref 70–99)
Potassium: 4.2 mEq/L (ref 3.5–5.1)
Sodium: 136 mEq/L (ref 135–145)
Total Bilirubin: 0.4 mg/dL (ref 0.2–1.2)
Total Protein: 7.3 g/dL (ref 6.0–8.3)

## 2020-11-12 LAB — TSH: TSH: 2.43 u[IU]/mL (ref 0.35–5.50)

## 2020-11-12 MED ORDER — LEVONORGEST-ETH ESTRAD 91-DAY 0.15-0.03 &0.01 MG PO TABS: 1.0000 | ORAL_TABLET | Freq: Every day | ORAL | 3 refills | Status: AC

## 2020-11-12 NOTE — Patient Instructions (Signed)
Use debrox over the counter for ear wax   I will order a thyroid ultrasound -you will get a call   Try to eat a little better  Try to get most of your carbohydrates from produce (with the exception of white potatoes)  Eat less bread/pasta/rice/snack foods/cereals/sweets and other items from the middle of the grocery store (processed carbs)  Think about regular exercise   Labs today   Flu shot today

## 2020-11-12 NOTE — Progress Notes (Signed)
Subjective:    Patient ID: Mary Crawford, female    DOB: 11-12-1995, 25 y.o.   MRN: [id number]  This visit occurred during the SARS-CoV-2 public health emergency.  Safety protocols were in place, including screening questions prior to the visit, additional usage of staff PPE, and extensive cleaning of exam room while observing appropriate contact time as indicated for disinfecting solutions.   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  Good and bad days  Still worth it   Not a lot of self care  Does try not to bring work home  Tired all the time  Sleeps but not resting well   Does not eat healthy  Making effort to bring salad for lunch   Then does not feel like cooking    Pap 8/17 gyn /Dr Shawnie Pons [facility] -no problems  Period is regular   Self breast exam : no lumps  Covid immunized  Flu shot-today  Tdap June 2013 Had HPV vaccines   Has had HIV screen in the past   H/o goiter and hypothyroidism / Hashimotos  Takes levothyroxine 25 mcg daily  Is very tired/ unsure if her thyroid has changed No hair or skin changes   Her goiter is about the same-  some days she thinks it looks bigger  No pain or swallowing problem   ? Last thyroid US was   Depression with anxiety  Goes to counselor -then she left   Sertraline 100 mg daily  Buspar 7.5 mg bid   Depression screen [facility] 2/9 11/12/2020 11/13/2019 11/13/2018 10/30/2017  Decreased Interest 2 2 1 2   Down, Depressed, Hopeless 2 2 1 1   PHQ - 2 Score 4 4 2 3   Altered sleeping 3 3 2 3   Tired, decreased energy 3 2 2 3   Change in appetite 3 3 2 3   Feeling bad or failure about yourself  1 2 1 2   Trouble concentrating 1 1 1  0  Moving slowly or fidgety/restless 0 1 2 1   Suicidal thoughts  1 1 0 0  PHQ-9 Score 16 17 12 15   Difficult doing work/chores Somewhat difficult Somewhat difficult - -     She is weathering stress well  Moving to [place] in June  Husband works there-they are apart now  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     Hyperlipidemia  Lab Results  Component Value Date   CHOL 212 (H) 11/13/2019   HDL 64.00 11/13/2019   LDLCALC 117 (H) 11/13/2019   TRIG 156.0 (H) 11/13/2019   CHOLHDL 3 11/13/2019   Patient Active Problem List   Diagnosis Date Noted   Encounter for annual routine gynecological examination 11/12/2020   Hyperlipidemia 11/13/2018   Depression with anxiety 04/20/2016   Routine general medical examination at a health care facility 01/05/2016   Pelvic pain in female 12/24/2014   Dysmenorrhea 01/14/2014   Migraine without aura 05/28/2013   History of depression 02/10/2013   Goiter    History of Hashimoto thyroiditis    Hypothyroidism, acquired, autoimmune    Fatigue    Orthostatic hypotension    Past Medical History:  Diagnosis Date   Abdominal pain, unspecified site  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    Depression    history of   Dyspepsia    Fatigue    Goiter    Headache    Migraines   Hypothyroidism, acquired, autoimmune    Nausea with vomiting    Orthostatic hypotension    Pain in joint, pelvic region and thigh    right hip pain   Pre-syncope 03/2008   cardiac referral for pre syncope 03/10   Thyroiditis, autoimmune    Thyrotoxicosis with Hashimoto thyroiditis    Tobacco smoke exposure    positive hx of passive tobacco smoke exposure   Past Surgical History:  Procedure Laterality Date   LAPAROSCOPY N/A 01/05/2015   Procedure: LAPAROSCOPY DIAGNOSTIC;  Surgeon: Reva Bores, MD;  Location: [facility];  Service: Gynecology;  Laterality: N/A;   TONSILLECTOMY AND ADENOIDECTOMY  11/2001   TONSILLECTOMY AND ADENOIDECTOMY     Social History   Tobacco Use   Smoking status: Never   Smokeless tobacco: Never   Substance Use Topics   Alcohol use: Yes    Alcohol/week: 0.0 standard drinks    Comment: occassional   Drug use: No   Family History  Problem Relation Age of Onset   Migraines Mother    Thyroid disease Mother    Heart disease Maternal Grandmother    Diabetes Maternal Grandfather    Diabetes Paternal Grandfather    Thyroid disease Paternal Grandfather    Allergies  Allergen Reactions   Aleve [Naproxen] Hives   Prochlorperazine Anxiety    restlessness   Prochlorperazine Edisylate Anxiety   Current Outpatient Medications on File Prior to Visit  Medication Sig Dispense Refill   acetaminophen (TYLENOL) 325 MG tablet Take 650 mg by mouth every 6 (six) hours as needed for mild pain or headache.     busPIRone (BUSPAR) 15 MG tablet TAKE 0.5 (1/2) TABLETS (7.5 MG TOTAL) BY MOUTH 2 (TWO) TIMES DAILY. 90 tablet 0   levothyroxine (SYNTHROID) 25 MCG tablet TAKE 1 TABLET BY MOUTH EVERY DAY 90 tablet 0   sertraline (ZOLOFT) 100 MG tablet TAKE 1 TABLET BY MOUTH EVERY DAY 90 tablet 0   No current facility-administered medications on file prior to visit.    Review of Systems  Constitutional:  Positive for fatigue. Negative for activity change, appetite change, fever and unexpected weight change.  HENT:  Negative for congestion, ear pain, rhinorrhea, sinus pressure and sore throat.   Eyes:  Negative for pain, redness and visual disturbance.  Respiratory:  Negative for cough, shortness of breath and wheezing.   Cardiovascular:  Negative for chest pain and palpitations.  Gastrointestinal:  Negative for abdominal pain, blood in stool, constipation and diarrhea.  Endocrine: Negative for polydipsia and polyuria.  Genitourinary:  Negative for dysuria, frequency and urgency.  Musculoskeletal:  Negative for arthralgias, back pain and myalgias.  Skin:  Negative for pallor and rash.  Allergic/Immunologic: Negative for environmental allergies.  Neurological:  Negative for dizziness, syncope and  headaches.  Hematological:  Negative for adenopathy. Does not bruise/bleed easily.  Psychiatric/Behavioral:  Negative for decreased concentration, dysphoric mood and suicidal ideas. The patient is nervous/anxious.       Objective:   Physical Exam Constitutional:      General: She is not in acute distress.    Appearance: Normal appearance. She is well-developed. She is obese. She is not ill-appearing or diaphoretic.  HENT:     Head: Normocephalic and atraumatic.     Right Ear: Tympanic membrane, ear canal and  external ear normal.     Left Ear: Tympanic membrane, ear canal and external ear normal.     Nose: Nose normal. No congestion.     Mouth/Throat:     Mouth: Mucous membranes are moist.     Pharynx: Oropharynx is clear. No posterior oropharyngeal erythema.  Eyes:     General: No scleral icterus.    Extraocular Movements: Extraocular movements intact.     Conjunctiva/sclera: Conjunctivae normal.     Pupils: Pupils are equal, round, and reactive to light.  Neck:     Thyroid: No thyromegaly.     Vascular: No carotid bruit or JVD.  Cardiovascular:     Rate and Rhythm: Normal rate and regular rhythm.     Pulses: Normal pulses.     Heart sounds: Normal heart sounds.    No gallop.  Pulmonary:     Effort: Pulmonary effort is normal. No respiratory distress.     Breath sounds: Normal breath sounds. No wheezing.     Comments: Good air exch Chest:     Chest wall: No tenderness.  Abdominal:     General: Bowel sounds are normal. There is no distension or abdominal bruit.     Palpations: Abdomen is soft. There is no mass.     Tenderness: There is no abdominal tenderness.     Hernia: No hernia is present.  Genitourinary:    Comments: Breast exam: No mass, nodules, thickening, tenderness, bulging, retraction, inflamation, nipple discharge or skin changes noted.  No axillary or clavicular LA.                   Anus appears normal w/o hemorrhoids or masses       External genitalia :  nl appearance and hair distribution/no lesions       Urethral meatus : nl size, no lesions or prolapse       Urethra: no masses, tenderness or scarring      Bladder : no masses or tenderness       Vagina: nl general appearance, no discharge or  Lesions, no significant cystocele  or rectocele       Cervix: no lesions/ discharge or friability      Uterus: nl size, contour, position, and mobility (not fixed) , non tender      Adnexa : no masses, tenderness, enlargement or nodularity          Musculoskeletal:        General: No tenderness. Normal range of motion.     Cervical back: Normal range of motion and neck supple. No rigidity. No muscular tenderness.     Right lower leg: No edema.     Left lower leg: No edema.  Lymphadenopathy:     Cervical: No cervical adenopathy.  Skin:    General: Skin is warm and dry.     Coloration: Skin is not pale.     Findings: No erythema or rash.  Neurological:     Mental Status: She is alert. Mental status is at baseline.     Cranial Nerves: No cranial nerve deficit.     Motor: No abnormal muscle tone.     Coordination: Coordination normal.     Gait: Gait normal.     Deep Tendon Reflexes: Reflexes are normal and symmetric. Reflexes normal.  Psychiatric:        Mood and Affect: Mood normal.        Cognition and Memory: Cognition and memory normal.     Comments:  Candidly talks about stressors          Assessment & Plan:   Problem List Items Addressed This Visit       Endocrine   Goiter    ? If some enlargement  H/o Hashimoto's  Ultrasound ordered  No swallowing problems  Lab Results  Component Value Date   TSH 2.43 11/12/2020         Relevant Orders   US THYROID   Hypothyroidism, acquired, autoimmune    Lab Results  Component Value Date   TSH 2.43 11/12/2020  No clinical changes but watching goiter Plan to continue levothyroxine 25 mcg daily      Relevant Orders   TSH (Completed)   US THYROID     Genitourinary    Dysmenorrhea    Plans to continue current OC        Relevant Medications   Levonorgestrel-Ethinyl Estradiol (AMETHIA) 0.15-0.03 &0.01 MG tablet     Other   Routine general medical examination at a health care facility - Primary    Reviewed health habits including diet and exercise and skin cancer prevention Reviewed appropriate screening tests for age  Also reviewed health mt list, fam hx and immunization status , as well as social and family history   See HPI Labs ordered  Gyn exam with pap done  Flu shot given  Encouraged regular exercise        Relevant Orders   CBC with Differential/Platelet (Completed)   Comprehensive metabolic panel (Completed)   Lipid panel (Completed)   TSH (Completed)   Flu Vaccine QUAD 6+ mos PF IM (Fluarix Quad PF) (Completed)   Depression with anxiety    Overall stable  Will eventually need a new counselor, but planning to move to [place] and wants to wait Plan to continue sertraline 100 mg daily buspar 7.5 mg bid Reviewed stressors/ coping techniques/symptoms/ support sources/ tx options and side effects in detail today A change in job stress may help       Hyperlipidemia    Disc goals for lipids and reasons to control them Rev last labs with pt Rev low sat fat diet in detail Labs today        Relevant Orders   Lipid panel (Completed)   Encounter for annual routine gynecological examination    Exam with pap  Happy with current OC No complaints        Relevant Orders   Cytology - PAP()   Other Visit Diagnoses     Need for influenza vaccination       Relevant Orders   Flu Vaccine QUAD 6+ mos PF IM (Fluarix Quad PF) (Completed)

## 2020-11-14 NOTE — Assessment & Plan Note (Signed)
Overall stable  Will eventually need a new counselor, but planning to move to [place] and wants to wait Plan to continue sertraline 100 mg daily buspar 7.5 mg bid Reviewed stressors/ coping techniques/symptoms/ support sources/ tx options and side effects in detail today A change in job stress may help

## 2020-11-14 NOTE — Assessment & Plan Note (Signed)
Lab Results  Component Value Date   TSH 2.43 11/12/2020   No clinical changes but watching goiter Plan to continue levothyroxine 25 mcg daily

## 2020-11-14 NOTE — Assessment & Plan Note (Signed)
Exam with pap  Happy with current OC No complaints

## 2020-11-14 NOTE — Assessment & Plan Note (Signed)
?   If some enlargement  H/o Hashimoto's  Ultrasound ordered  No swallowing problems  Lab Results  Component Value Date   TSH 2.43 11/12/2020

## 2020-11-14 NOTE — Assessment & Plan Note (Signed)
Plans to continue current OC

## 2020-11-14 NOTE — Assessment & Plan Note (Signed)
Reviewed health habits including diet and exercise and skin cancer prevention Reviewed appropriate screening tests for age  Also reviewed health mt list, fam hx and immunization status , as well as social and family history   See HPI Labs ordered  Gyn exam with pap done  Flu shot given  Encouraged regular exercise

## 2020-11-14 NOTE — Assessment & Plan Note (Signed)
Disc goals for lipids and reasons to control them Rev last labs with pt Rev low sat fat diet in detail Labs today   

## 2020-11-15 ENCOUNTER — Encounter: Payer: Self-pay | Admitting: *Deleted

## 2020-11-16 LAB — CYTOLOGY - PAP
Comment: NEGATIVE
Diagnosis: NEGATIVE
High risk HPV: NEGATIVE

## 2020-12-01 ENCOUNTER — Telehealth: Payer: BC Managed Care – PPO | Admitting: Physician Assistant

## 2020-12-01 DIAGNOSIS — J029 Acute pharyngitis, unspecified: Secondary | ICD-10-CM

## 2020-12-01 NOTE — Progress Notes (Signed)
I have spent 5 minutes in review of e-visit questionnaire, review and updating patient chart, medical decision making and response to patient.   Mica Releford Cody Evaristo Tsuda, PA-C    

## 2020-12-01 NOTE — Progress Notes (Signed)
  E-Visit for Sore Throat  We are sorry that you are not feeling well.  Here is how we plan to help!  Your symptoms indicate a likely viral infection (Pharyngitis).   Pharyngitis is inflammation in the back of the throat which can cause a sore throat, scratchiness and sometimes difficulty swallowing.   Pharyngitis is typically caused by a respiratory virus and will just run its course.  Please keep in mind that your symptoms could last up to 10 days.  For throat pain, we recommend over the counter oral pain relief medications such as acetaminophen or aspirin, or anti-inflammatory medications such as ibuprofen or naproxen sodium.  Topical treatments such as oral throat lozenges or sprays may be used as needed.  Avoid close contact with loved ones, especially the very young and elderly.  Remember to wash your hands thoroughly throughout the day as this is the number one way to prevent the spread of infection and wipe down door knobs and counters with disinfectant.  After careful review of your answers, I would not recommend and antibiotic for your condition.  Antibiotics should not be used to treat conditions that we suspect are caused by viruses like the virus that causes the common cold or flu. However, some people can have Strep with atypical symptoms. You may need formal testing in clinic or office to confirm if your symptoms continue or worsen.  Providers prescribe antibiotics to treat infections caused by bacteria. Antibiotics are very powerful in treating bacterial infections when they are used properly.  To maintain their effectiveness, they should be used only when necessary.  Overuse of antibiotics has resulted in the development of super bugs that are resistant to treatment!    I have sent a work note to Pharmacologist.  Home Care: Only take medications as instructed by your medical team. Do not drink alcohol while taking these medications. A steam or ultrasonic humidifier can help congestion.   You can place a towel over your head and breathe in the steam from hot water coming from a faucet. Avoid close contacts especially the very young and the elderly. Cover your mouth when you cough or sneeze. Always remember to wash your hands.  Get Help Right Away If: You develop worsening fever or throat pain. You develop a severe head ache or visual changes. Your symptoms persist after you have completed your treatment plan.  Make sure you Understand these instructions. Will watch your condition. Will get help right away if you are not doing well or get worse.   Thank you for choosing an e-visit.  Your e-visit answers were reviewed by a board certified advanced clinical practitioner to complete your personal care plan. Depending upon the condition, your plan could have included both over the counter or prescription medications.  Please review your pharmacy choice. Make sure the pharmacy is open so you can pick up prescription now. If there is a problem, you may contact your provider through [employer] and have the prescription routed to another pharmacy.  Your safety is important to [place]. If you have drug allergies check your prescription carefully.   For the next 24 hours you can use MyChart to ask questions about today's visit, request a non-urgent call back, or ask for a work or school excuse. You will get an email in the next two days asking about your experience. I hope that your e-visit has been valuable and will speed your recovery.

## 2021-01-05 ENCOUNTER — Telehealth: Payer: BC Managed Care – PPO | Admitting: Physician Assistant

## 2021-01-05 DIAGNOSIS — A084 Viral intestinal infection, unspecified: Secondary | ICD-10-CM

## 2021-01-05 MED ORDER — ONDANSETRON 4 MG PO TBDP: 4.0000 mg | ORAL_TABLET | Freq: Three times a day (TID) | ORAL | 0 refills | Status: AC | PRN

## 2021-01-05 NOTE — Progress Notes (Signed)
I have spent 5 minutes in review of e-visit questionnaire, review and updating patient chart, medical decision making and response to patient.   Odean Mcelwain Cody Darvis Croft, PA-C    

## 2021-01-05 NOTE — Progress Notes (Signed)

## 2021-01-24 ENCOUNTER — Other Ambulatory Visit: Payer: Self-pay | Admitting: Family Medicine

## 2021-01-28 ENCOUNTER — Other Ambulatory Visit: Payer: Self-pay | Admitting: Family Medicine

## 2021-01-31 ENCOUNTER — Other Ambulatory Visit: Payer: Self-pay | Admitting: Family Medicine

## 2021-01-31 ENCOUNTER — Telehealth: Payer: BC Managed Care – PPO | Admitting: Physician Assistant

## 2021-01-31 DIAGNOSIS — J069 Acute upper respiratory infection, unspecified: Secondary | ICD-10-CM

## 2021-01-31 MED ORDER — BENZONATATE 100 MG PO CAPS: 100.0000 mg | ORAL_CAPSULE | Freq: Three times a day (TID) | ORAL | 0 refills | Status: AC | PRN

## 2021-01-31 MED ORDER — IPRATROPIUM BROMIDE 0.03 % NA SOLN: 2.0000 | Freq: Two times a day (BID) | NASAL | 0 refills | Status: AC

## 2021-01-31 NOTE — Progress Notes (Signed)

## 2021-02-17 ENCOUNTER — Telehealth: Payer: BC Managed Care – PPO | Admitting: Family Medicine

## 2021-02-17 ENCOUNTER — Encounter: Payer: Self-pay | Admitting: Family Medicine

## 2021-02-17 DIAGNOSIS — J029 Acute pharyngitis, unspecified: Secondary | ICD-10-CM | POA: Diagnosis not present

## 2021-02-17 NOTE — Progress Notes (Signed)
E-Visit for Sore Throat  We are sorry that you are not feeling well.  Here is how we plan to help!  Your symptoms indicate a likely viral infection (Pharyngitis).   Pharyngitis is inflammation in the back of the throat which can cause a sore throat, scratchiness and sometimes difficulty swallowing.   Pharyngitis is typically caused by a respiratory virus and will just run its course.  Please keep in mind that your symptoms could last up to 10 days.  For throat pain, we recommend over the counter oral pain relief medications such as acetaminophen or aspirin, or anti-inflammatory medications such as ibuprofen or naproxen sodium.  Topical treatments such as oral throat lozenges or sprays may be used as needed.  Avoid close contact with loved ones, especially the very young and elderly.  Remember to wash your hands thoroughly throughout the day as this is the number one way to prevent the spread of infection and wipe down door knobs and counters with disinfectant.  After careful review of your answers, I would not recommend an antibiotic for your condition.  Antibiotics should not be used to treat conditions that we suspect are caused by viruses like the virus that causes the common cold or flu. However, some people can have Strep with atypical symptoms. You may need formal testing in clinic or office to confirm if your symptoms continue or worsen.  Providers prescribe antibiotics to treat infections caused by bacteria. Antibiotics are very powerful in treating bacterial infections when they are used properly.  To maintain their effectiveness, they should be used only when necessary.  Overuse of antibiotics has resulted in the development of super bugs that are resistant to treatment!    Home Care: Only take medications as instructed by your medical team. Do not drink alcohol while taking these medications. A steam or ultrasonic humidifier can help congestion.  You can place a towel over your head and  breathe in the steam from hot water coming from a faucet. Avoid close contacts especially the very young and the elderly. Cover your mouth when you cough or sneeze. Always remember to wash your hands.  Get Help Right Away If: You develop worsening fever or throat pain. You develop a severe head ache or visual changes. Your symptoms persist after you have completed your treatment plan.  Make sure you Understand these instructions. Will watch your condition. Will get help right away if you are not doing well or get worse.   Thank you for choosing an e-visit.  Your e-visit answers were reviewed by a board certified advanced clinical practitioner to complete your personal care plan. Depending upon the condition, your plan could have included both over the counter or prescription medications.  Please review your pharmacy choice. Make sure the pharmacy is open so you can pick up prescription now. If there is a problem, you may contact your provider through MyChart messaging and have the prescription routed to another pharmacy.  Your safety is important to us. If you have drug allergies check your prescription carefully.   For the next 24 hours you can use MyChart to ask questions about today's visit, request a non-urgent call back, or ask for a work or school excuse. You will get an email in the next two days asking about your experience. I hope that your e-visit has been valuable and will speed your recovery.   I provided 5 minutes of non face-to-face time during this encounter for chart review, medication and order placement, as   well as and documentation.   

## 2021-02-22 ENCOUNTER — Other Ambulatory Visit: Payer: Self-pay | Admitting: Physician Assistant

## 2021-02-22 DIAGNOSIS — J069 Acute upper respiratory infection, unspecified: Secondary | ICD-10-CM

## 2021-08-17 ENCOUNTER — Other Ambulatory Visit: Payer: Self-pay | Admitting: Family Medicine

## 2021-08-18 NOTE — Telephone Encounter (Signed)
Last filled 04/29/21 Last ov 11/12/20
